# Patient Record
Sex: Female | Born: 1965 | Race: White | Hispanic: No | Marital: Married | Smoking: Never smoker
Health system: Southern US, Community
[De-identification: ages and names within clinical notes are randomized; demographics above are authoritative.]

## PROBLEM LIST (undated history)

## (undated) DIAGNOSIS — N390 Urinary tract infection, site not specified: Secondary | ICD-10-CM

## (undated) DIAGNOSIS — R300 Dysuria: Secondary | ICD-10-CM

## (undated) HISTORY — DX: Dysuria: R30.0

## (undated) HISTORY — PX: COLONOSCOPY WITH PROPOFOL: SHX5780

## (undated) HISTORY — DX: Urinary tract infection, site not specified: N39.0

---

## 2005-09-23 ENCOUNTER — Inpatient Hospital Stay: Payer: Self-pay | Admitting: Obstetrics and Gynecology

## 2007-11-10 ENCOUNTER — Ambulatory Visit: Payer: Self-pay | Admitting: Obstetrics and Gynecology

## 2008-02-21 ENCOUNTER — Ambulatory Visit: Payer: Self-pay | Admitting: Gastroenterology

## 2008-11-24 ENCOUNTER — Ambulatory Visit: Payer: Self-pay | Admitting: Obstetrics and Gynecology

## 2009-12-13 ENCOUNTER — Ambulatory Visit: Payer: Self-pay | Admitting: Obstetrics and Gynecology

## 2011-01-14 ENCOUNTER — Ambulatory Visit: Payer: Self-pay | Admitting: Obstetrics and Gynecology

## 2011-08-28 ENCOUNTER — Emergency Department: Payer: Self-pay | Admitting: *Deleted

## 2011-09-03 ENCOUNTER — Emergency Department: Payer: Self-pay | Admitting: Internal Medicine

## 2012-01-19 ENCOUNTER — Ambulatory Visit: Payer: Self-pay | Admitting: Obstetrics and Gynecology

## 2013-01-19 ENCOUNTER — Ambulatory Visit: Payer: Self-pay | Admitting: Obstetrics and Gynecology

## 2013-12-30 ENCOUNTER — Emergency Department: Payer: Self-pay | Admitting: Emergency Medicine

## 2013-12-30 LAB — CBC
HCT: 41 % (ref 35.0–47.0)
HGB: 13.4 g/dL (ref 12.0–16.0)
MCH: 29.9 pg (ref 26.0–34.0)
MCHC: 32.5 g/dL (ref 32.0–36.0)
MCV: 92 fL (ref 80–100)
Platelet: 228 10*3/uL (ref 150–440)
RBC: 4.47 10*6/uL (ref 3.80–5.20)
RDW: 13 % (ref 11.5–14.5)
WBC: 8.1 10*3/uL (ref 3.6–11.0)

## 2013-12-30 LAB — COMPREHENSIVE METABOLIC PANEL
Albumin: 4.3 g/dL (ref 3.4–5.0)
Alkaline Phosphatase: 69 U/L
Anion Gap: 6 — ABNORMAL LOW (ref 7–16)
BUN: 19 mg/dL — ABNORMAL HIGH (ref 7–18)
Bilirubin,Total: 0.7 mg/dL (ref 0.2–1.0)
Calcium, Total: 8.7 mg/dL (ref 8.5–10.1)
Chloride: 107 mmol/L (ref 98–107)
Co2: 24 mmol/L (ref 21–32)
Creatinine: 0.82 mg/dL (ref 0.60–1.30)
EGFR (African American): >60
EGFR (Non-African Amer.): >60
Glucose: 109 mg/dL — ABNORMAL HIGH (ref 65–99)
Osmolality: 277 (ref 275–301)
Potassium: 3.6 mmol/L (ref 3.5–5.1)
SGOT(AST): 31 U/L (ref 15–37)
SGPT (ALT): 20 U/L
Sodium: 137 mmol/L (ref 136–145)
Total Protein: 8.1 g/dL (ref 6.4–8.2)

## 2013-12-30 LAB — URINALYSIS, COMPLETE
Bacteria: NONE SEEN
Bilirubin,UR: NEGATIVE
Glucose,UR: NEGATIVE mg/dL (ref 0–75)
Ketone: NEGATIVE
Leukocyte Esterase: NEGATIVE
Nitrite: NEGATIVE
Ph: 6 (ref 4.5–8.0)
Protein: NEGATIVE
RBC,UR: 3 /HPF (ref 0–5)
Specific Gravity: 1.026 (ref 1.003–1.030)
Squamous Epithelial: <1
WBC UR: <1 /HPF (ref 0–5)

## 2014-01-30 ENCOUNTER — Ambulatory Visit: Payer: Self-pay | Admitting: Obstetrics and Gynecology

## 2015-01-12 ENCOUNTER — Ambulatory Visit: Payer: Self-pay

## 2015-01-23 ENCOUNTER — Other Ambulatory Visit: Payer: Self-pay | Admitting: Obstetrics and Gynecology

## 2015-01-23 DIAGNOSIS — Z1231 Encounter for screening mammogram for malignant neoplasm of breast: Secondary | ICD-10-CM

## 2015-02-05 ENCOUNTER — Encounter: Payer: Self-pay | Admitting: *Deleted

## 2015-02-05 ENCOUNTER — Ambulatory Visit (INDEPENDENT_AMBULATORY_CARE_PROVIDER_SITE_OTHER): Payer: 59 | Admitting: Obstetrics and Gynecology

## 2015-02-05 VITALS — BP 110/75 | HR 75 | Resp 16 | Ht 64.0 in | Wt 150.7 lb

## 2015-02-05 DIAGNOSIS — N959 Unspecified menopausal and perimenopausal disorder: Secondary | ICD-10-CM | POA: Diagnosis not present

## 2015-02-05 DIAGNOSIS — N390 Urinary tract infection, site not specified: Secondary | ICD-10-CM

## 2015-02-05 LAB — MICROSCOPIC EXAMINATION
Bacteria, UA: NONE SEEN
RBC, UA: NONE SEEN /hpf (ref 0–?)
Renal Epithel, UA: NONE SEEN /hpf
WBC, UA: NONE SEEN /hpf (ref 0–?)

## 2015-02-05 LAB — URINALYSIS, COMPLETE
Bilirubin, UA: NEGATIVE
Glucose, UA: NEGATIVE
Ketones, UA: NEGATIVE
Leukocytes, UA: NEGATIVE
Nitrite, UA: NEGATIVE
Protein, UA: NEGATIVE
RBC, UA: NEGATIVE
Specific Gravity, UA: >1.03 — ABNORMAL HIGH (ref 1.005–1.030)
Urobilinogen, Ur: 0.2 mg/dL (ref 0.2–1.0)
pH, UA: 5.5 (ref 5.0–7.5)

## 2015-02-05 LAB — BLADDER SCAN AMB NON-IMAGING: Scan Result: 38

## 2015-02-05 MED ORDER — CEPHALEXIN 250 MG PO CAPS: ORAL_CAPSULE | ORAL | Status: DC

## 2015-02-05 MED ORDER — ESTROGENS, CONJUGATED 0.625 MG/GM VA CREA: 1.0000 | TOPICAL_CREAM | Freq: Every day | VAGINAL | Status: DC

## 2015-02-05 NOTE — Patient Instructions (Signed)

## 2015-02-05 NOTE — Progress Notes (Signed)
02/05/2015 10:19 AM   Kari Guerrero [birth year] [id number]  Referring provider: No referring provider defined for this encounter.  Chief Complaint  Patient presents with  . Establish Care  . Recurrent UTI    HPI: Patient is a 49 year old female presenting today as a referral from her GYN provider complaint of recurrent urinary tract infections. She states that they have at been occurring for many years ever since she has been married to her husband. She does notice an association between intercourse and urinary tract infections. She has noticed that they has become more frequent with 3 in the last year. No possible exposure to STI's. He denies any vaginal complaints. She is asymptomatic today.   Increasing fluids, drinking cranberry juice and voiding post intercourse typically improves her symptoms.  No urinary symptoms at baseline.   No stone history.  She is post menopausal.  LMP 1 year ago.   PMH: Past Medical History  Diagnosis Date  . Dysuria   . UTI (lower urinary tract infection)     Surgical History: Past Surgical History  Procedure Laterality Date  . Cesarean section      Home Medications:    Medication List       This list is accurate as of: 02/05/15 10:19 AM.  Always use your most recent med list.               MULTI-VITAMINS Tabs  Take by mouth.        Allergies:  Allergies  Allergen Reactions  . Codeine     Other reaction(s): Vomiting  . Macrobid [employer] Macro]   . Sulfa Antibiotics     Family History: Family History  Problem Relation Age of Onset  . Colon cancer Father     Social History:  reports that she has never smoked. She does not have any smokeless tobacco history on file. She reports that she does not drink alcohol or use illicit drugs.  ROS: UROLOGY Frequent Urination?: Yes Hard to postpone urination?: Yes Burning/pain with urination?: Yes Get up at night to urinate?: No Leakage of urine?: No Urine  stream starts and stops?: No Trouble starting stream?: No Do you have to strain to urinate?: No Blood in urine?: No Urinary tract infection?: Yes Sexually transmitted disease?: No Injury to kidneys or bladder?: No Painful intercourse?: No Weak stream?: No Currently pregnant?: No Vaginal bleeding?: No Last menstrual period?: 12/2013  Gastrointestinal Nausea?: No Vomiting?: No Indigestion/heartburn?: No Diarrhea?: No Constipation?: No  Constitutional Fever: No Night sweats?: No Weight loss?: No Fatigue?: No  Skin Skin rash/lesions?: No Itching?: No  Eyes Blurred vision?: No Double vision?: No  Ears/Nose/Throat Sore throat?: No Sinus problems?: No  Hematologic/Lymphatic Swollen glands?: No Easy bruising?: No  Cardiovascular Leg swelling?: No Chest pain?: No  Respiratory Cough?: No Shortness of breath?: No  Endocrine Excessive thirst?: No  Musculoskeletal Back pain?: No Joint pain?: No  Neurological Headaches?: No Dizziness?: No  Psychologic Depression?: No Anxiety?: No  Physical Exam: BP 110/75 mmHg  Pulse 75  Resp 16  Ht 5\' 4"  (1.626 m)  Wt 150 lb 11.2 oz (68.357 kg)  BMI 25.85 kg/m2  Constitutional:  Alert and oriented, No acute distress. HEENT: [place] AT, moist mucus membranes.  Trachea midline, no masses. Cardiovascular: No clubbing, cyanosis, or edema. Respiratory: Normal respiratory effort, no increased work of breathing. GI: Abdomen is soft, nontender, nondistended, no abdominal masses GU: No CVA tenderness.  Skin: No rashes, bruises or suspicious lesions. Lymph: No cervical or  inguinal adenopathy. Neurologic: Grossly intact, no focal deficits, moving all 4 extremities. Psychiatric: Normal mood and affect.  Laboratory Data:   Urinalysis    Component Value Date/Time   COLORURINE Yellow 12/30/2013 0315   APPEARANCEUR Clear 12/30/2013 0315   LABSPEC 1.026 12/30/2013 0315   PHURINE 6.0 12/30/2013 0315   GLUCOSEU Negative  12/30/2013 0315   HGBUR 1+ 12/30/2013 0315   BILIRUBINUR Negative 12/30/2013 0315   KETONESUR Negative 12/30/2013 0315   PROTEINUR Negative 12/30/2013 0315   NITRITE Negative 12/30/2013 0315   LEUKOCYTESUR Negative 12/30/2013 0315    Pertinent Imaging:   Assessment & Plan:   1. Frequent UTI- UTI prevention strategies discussed.  Good perineal hygiene reviewed. Patient is encouraged to increase daily water intake, start cranberry supplements to prevent invasive colonization along the urinary tract and probiotics, especially lactobacillus to restore normal vaginal flora. -Post coital Keflex prescribed. - Urinalysis, Complete - BLADDER SCAN AMB NON-IMAGING  2. Vaginal atrophy-  Vaginal estrogen cream prescribed to be applied to urethra M-W-F nights. Also suggested natural vaginal lubricants such as coconut and olive oils prior to intercourse to reduce frictional irritation.  Return in about 3 months (around 05/08/2015) for Recurrent UTI.  These notes generated with voice recognition software. I apologize for typographical errors.  Herbert Moors, [facility] [address], [place], [place] [address] [phone]

## 2015-02-06 ENCOUNTER — Ambulatory Visit
Admission: RE | Admit: 2015-02-06 | Discharge: 2015-02-06 | Disposition: A | Discharge status: Home / Self Care | Payer: 59 | Source: Ambulatory Visit | Attending: Obstetrics and Gynecology | Admitting: Obstetrics and Gynecology

## 2015-02-06 DIAGNOSIS — Z1231 Encounter for screening mammogram for malignant neoplasm of breast: Secondary | ICD-10-CM

## 2015-05-08 ENCOUNTER — Encounter: Payer: Self-pay | Admitting: Obstetrics and Gynecology

## 2015-05-08 ENCOUNTER — Ambulatory Visit (INDEPENDENT_AMBULATORY_CARE_PROVIDER_SITE_OTHER): Payer: 59 | Admitting: Obstetrics and Gynecology

## 2015-05-08 VITALS — BP 109/73 | HR 68 | Resp 16 | Ht 64.0 in | Wt 153.7 lb

## 2015-05-08 DIAGNOSIS — N390 Urinary tract infection, site not specified: Secondary | ICD-10-CM | POA: Diagnosis not present

## 2015-05-08 LAB — URINALYSIS, COMPLETE
Bilirubin, UA: NEGATIVE
Glucose, UA: NEGATIVE
Ketones, UA: NEGATIVE
Leukocytes, UA: NEGATIVE
Nitrite, UA: NEGATIVE
Protein, UA: NEGATIVE
Specific Gravity, UA: >1.03 — ABNORMAL HIGH (ref 1.005–1.030)
Urobilinogen, Ur: 0.2 mg/dL (ref 0.2–1.0)
pH, UA: 5 (ref 5.0–7.5)

## 2015-05-08 LAB — MICROSCOPIC EXAMINATION

## 2015-05-08 NOTE — Progress Notes (Signed)
1:16 PM   Kari Guerrero 08/16/1965 [id number]  Referring provider: No referring provider defined for this encounter.  Chief Complaint  Patient presents with  . Recurrent UTI    HPI: Patient is a 50 year old female presenting today as a referral from her GYN provider complaint of recurrent urinary tract infections. She states that they have at been occurring for many years ever since she has been married to her husband. She does notice an association between intercourse and urinary tract infections. She has noticed that they has become more frequent with 3 in the last year. No possible exposure to STI's. He denies any vaginal complaints. She is asymptomatic today.   Increasing fluids, drinking cranberry juice and voiding post intercourse typically improves her symptoms.  No urinary symptoms at baseline.   No stone history.  She is post menopausal.  LMP 1 year ago.  Interval History: Patient presents today for follow-up. She denies any urinary symptoms since her last visit. She has been using Coconut oil for vaginal moisturizer and for lubrication. She has increased her fluid intake has been taking her daily cranberry supplements. She reports that she has been feeling very well. She did not start using her vaginal estrogen cream as discussed at last visit because she felt that the coconut oil was working very well.   PMH: Past Medical History  Diagnosis Date  . Dysuria   . UTI (lower urinary tract infection)     Surgical History: Past Surgical History  Procedure Laterality Date  . Cesarean section      Home Medications:    Medication List       This list is accurate as of: 05/08/15 11:59 PM.  Always use your most recent med list.               conjugated estrogens vaginal cream  Commonly known as:  PREMARIN  Place 1 Applicatorful vaginally daily. Apply blueberry sized amount of cream to urethra (vaginal opening) 3 times per week at bedtime.     MULTI-VITAMINS  Tabs  Take by mouth.        Allergies:  Allergies  Allergen Reactions  . Codeine     Other reaction(s): Vomiting  . Macrobid [employer] Macro]   . Sulfa Antibiotics     Family History: Family History  Problem Relation Age of Onset  . Colon cancer Father   . Breast cancer Paternal Aunt     2. >50  . Breast cancer Maternal Grandmother 26    Social History:  reports that she has never smoked. She has never used smokeless tobacco. She reports that she does not drink alcohol or use illicit drugs.  ROS: UROLOGY Frequent Urination?: No Hard to postpone urination?: No Burning/pain with urination?: No Get up at night to urinate?: No Leakage of urine?: No Urine stream starts and stops?: No Trouble starting stream?: No Do you have to strain to urinate?: No Blood in urine?: No Urinary tract infection?: No Sexually transmitted disease?: No Injury to kidneys or bladder?: No Painful intercourse?: No Weak stream?: No Currently pregnant?: No Vaginal bleeding?: No Last menstrual period?: 12/2013  Gastrointestinal Nausea?: No Vomiting?: No Indigestion/heartburn?: No Diarrhea?: No Constipation?: No  Constitutional Fever: No Night sweats?: No Weight loss?: No Fatigue?: No  Skin Skin rash/lesions?: No Itching?: No  Eyes Blurred vision?: No Double vision?: No  Ears/Nose/Throat Sore throat?: No Sinus problems?: No  Hematologic/Lymphatic Swollen glands?: No Easy bruising?: No  Cardiovascular Leg swelling?: No Chest pain?: No  Respiratory Cough?: No Shortness of breath?: No  Endocrine Excessive thirst?: No  Musculoskeletal Back pain?: No Joint pain?: No  Neurological Headaches?: No Dizziness?: No  Psychologic Depression?: No Anxiety?: No  Physical Exam: BP 109/73 mmHg  Pulse 68  Resp 16  Ht 5\' 4"  (1.626 m)  Wt 153 lb 11.2 oz (69.718 kg)  BMI 26.37 kg/m2  Constitutional:  Alert and oriented, No acute distress. HEENT: [place] AT,  moist mucus membranes.  Trachea midline, no masses. Cardiovascular: No clubbing, cyanosis, or edema. Respiratory: Normal respiratory effort, no increased work of breathing. GI: Abdomen is soft, nontender, nondistended, no abdominal masses GU: No CVA tenderness.  Skin: No rashes, bruises or suspicious lesions. Lymph: No cervical or inguinal adenopathy. Neurologic: Grossly intact, no focal deficits, moving all 4 extremities. Psychiatric: Normal mood and affect.  Laboratory Data:   Urinalysis    Component Value Date/Time   COLORURINE Yellow 12/30/2013 0315   APPEARANCEUR Clear 12/30/2013 0315   LABSPEC 1.026 12/30/2013 0315   PHURINE 6.0 12/30/2013 0315   GLUCOSEU Negative 05/08/2015 0843   GLUCOSEU Negative 12/30/2013 0315   HGBUR 1+ 12/30/2013 0315   BILIRUBINUR Negative 05/08/2015 0843   BILIRUBINUR Negative 12/30/2013 0315   KETONESUR Negative 12/30/2013 0315   PROTEINUR Negative 12/30/2013 0315   NITRITE Negative 05/08/2015 0843   NITRITE Negative 12/30/2013 0315   LEUKOCYTESUR Negative 05/08/2015 0843   LEUKOCYTESUR Negative 12/30/2013 0315    Pertinent Imaging:   Assessment & Plan:   1. Frequent UTI- - Continue UTI prevention strategies -Continue post coital Keflex as needed - Urinalysis, Complete - BLADDER SCAN AMB NON-IMAGING  2. Vaginal atrophy-  Continue vaginal moisturizers.  Return if symptoms worsen or fail to improve.  These notes generated with voice recognition software. I apologize for typographical errors.  Herbert Moors, [facility] [address], [place],  [address] [phone]

## 2015-12-18 ENCOUNTER — Telehealth: Payer: Self-pay | Admitting: Urology

## 2015-12-18 NOTE — Telephone Encounter (Signed)
Patient called and stated that she was having frequent urination and pain with urination. She feels that she may have a UTI. Per Kari Guerrero she can come by and drop off a urine sample and see her to discuss her symptoms.  Patient is on the schedule for 8:30 12-19-15  Just wanted you to be aware to watch for any UA cultures    Thanks, [name]

## 2015-12-19 ENCOUNTER — Ambulatory Visit (INDEPENDENT_AMBULATORY_CARE_PROVIDER_SITE_OTHER): Payer: 59

## 2015-12-19 VITALS — BP 112/74 | HR 74 | Temp 98.3°F | Wt 153.9 lb

## 2015-12-19 DIAGNOSIS — N390 Urinary tract infection, site not specified: Secondary | ICD-10-CM | POA: Diagnosis not present

## 2015-12-19 LAB — URINALYSIS, COMPLETE
Bilirubin, UA: NEGATIVE
Glucose, UA: NEGATIVE
Ketones, UA: NEGATIVE
Nitrite, UA: NEGATIVE
Protein, UA: NEGATIVE
Specific Gravity, UA: 1.025 (ref 1.005–1.030)
Urobilinogen, Ur: 0.2 mg/dL (ref 0.2–1.0)
pH, UA: 5.5 (ref 5.0–7.5)

## 2015-12-19 LAB — MICROSCOPIC EXAMINATION: WBC, UA: >30 /hpf — AB (ref 0–?)

## 2015-12-19 NOTE — Telephone Encounter (Signed)
Please make sure that the patient performs the proper procedure to collect her urine.  I also want her to wait until the UA is completed so that I can be sure it is not a contaminated specimen.

## 2015-12-19 NOTE — Progress Notes (Signed)
Pt came in today with c/o urinary frequency, hard to postpone urination, and dysuria. Pt gave a clean catch specimen for u/a and cx.  Blood pressure 112/74, pulse 74, temperature 98.3 F (36.8 C), weight 153 lb 14.4 oz (69.8 kg).

## 2015-12-22 ENCOUNTER — Other Ambulatory Visit: Payer: Self-pay | Admitting: Urology

## 2015-12-22 LAB — CULTURE, URINE COMPREHENSIVE

## 2015-12-22 MED ORDER — CIPROFLOXACIN HCL 500 MG PO TABS: 500.0000 mg | ORAL_TABLET | Freq: Two times a day (BID) | ORAL | 0 refills | Status: DC

## 2015-12-24 ENCOUNTER — Telehealth: Payer: Self-pay | Admitting: Family Medicine

## 2015-12-24 NOTE — Telephone Encounter (Signed)
Left message on machine for patient to return call

## 2015-12-24 NOTE — Telephone Encounter (Signed)
-----   Message from Nori Riis, PA-C sent at 12/22/2015  6:25 PM EDT ----- Please notify the patient that she has a positive urine culture.  I have sent a prescription for ciprofloxacin 500 mg twice daily 7 days to the [facility] in [place].  If she is still taking the Keflex after sex, she needs to discontinue this antibiotic and tell the ciprofloxacin is completed. She may then resume the Keflex after sex.  She does not need to follow-up unless her symptoms persist or worsen.  She will need an appointment in January if she wishes to continue the post coital antibiotic, Keflex.

## 2015-12-24 NOTE — Telephone Encounter (Signed)
Patient notified and voiced understanding. She is not currently using the Keflex ABX after intercourse. She never started that and does not feel comfortable taking an antibiotic that often.

## 2016-01-29 ENCOUNTER — Other Ambulatory Visit: Payer: Self-pay | Admitting: Obstetrics and Gynecology

## 2016-01-29 DIAGNOSIS — Z1231 Encounter for screening mammogram for malignant neoplasm of breast: Secondary | ICD-10-CM

## 2016-03-05 ENCOUNTER — Ambulatory Visit
Admission: RE | Admit: 2016-03-05 | Discharge: 2016-03-05 | Disposition: A | Discharge status: Home / Self Care | Payer: 59 | Source: Ambulatory Visit | Attending: Obstetrics and Gynecology | Admitting: Obstetrics and Gynecology

## 2016-03-05 DIAGNOSIS — Z1231 Encounter for screening mammogram for malignant neoplasm of breast: Secondary | ICD-10-CM

## 2017-02-03 ENCOUNTER — Other Ambulatory Visit: Payer: Self-pay | Admitting: Obstetrics and Gynecology

## 2017-02-03 DIAGNOSIS — Z1231 Encounter for screening mammogram for malignant neoplasm of breast: Secondary | ICD-10-CM

## 2017-03-11 ENCOUNTER — Ambulatory Visit
Admission: RE | Admit: 2017-03-11 | Discharge: 2017-03-11 | Disposition: A | Discharge status: Home / Self Care | Payer: 59 | Source: Ambulatory Visit | Attending: Obstetrics and Gynecology | Admitting: Obstetrics and Gynecology

## 2017-03-11 DIAGNOSIS — Z1231 Encounter for screening mammogram for malignant neoplasm of breast: Secondary | ICD-10-CM | POA: Diagnosis not present

## 2018-02-09 ENCOUNTER — Other Ambulatory Visit: Payer: Self-pay | Admitting: Obstetrics and Gynecology

## 2018-02-09 DIAGNOSIS — Z1231 Encounter for screening mammogram for malignant neoplasm of breast: Secondary | ICD-10-CM

## 2018-03-16 ENCOUNTER — Ambulatory Visit
Admission: RE | Admit: 2018-03-16 | Discharge: 2018-03-16 | Disposition: A | Discharge status: Home / Self Care | Payer: Managed Care, Other (non HMO) | Source: Ambulatory Visit | Attending: Obstetrics and Gynecology | Admitting: Obstetrics and Gynecology

## 2018-03-16 DIAGNOSIS — Z1231 Encounter for screening mammogram for malignant neoplasm of breast: Secondary | ICD-10-CM | POA: Diagnosis present

## 2019-02-15 ENCOUNTER — Other Ambulatory Visit: Payer: Self-pay | Admitting: Obstetrics and Gynecology

## 2019-02-15 DIAGNOSIS — Z1231 Encounter for screening mammogram for malignant neoplasm of breast: Secondary | ICD-10-CM

## 2019-03-22 ENCOUNTER — Ambulatory Visit
Admission: RE | Admit: 2019-03-22 | Discharge: 2019-03-22 | Disposition: A | Discharge status: Home / Self Care | Payer: Managed Care, Other (non HMO) | Source: Ambulatory Visit | Attending: Obstetrics and Gynecology | Admitting: Obstetrics and Gynecology

## 2019-03-22 DIAGNOSIS — Z1231 Encounter for screening mammogram for malignant neoplasm of breast: Secondary | ICD-10-CM | POA: Diagnosis present

## 2019-11-02 ENCOUNTER — Other Ambulatory Visit: Payer: Self-pay

## 2019-11-02 ENCOUNTER — Other Ambulatory Visit: Payer: Self-pay | Admitting: Dermatology

## 2019-11-02 ENCOUNTER — Ambulatory Visit: Payer: Managed Care, Other (non HMO) | Admitting: Dermatology

## 2019-11-02 DIAGNOSIS — D485 Neoplasm of uncertain behavior of skin: Secondary | ICD-10-CM

## 2019-11-02 NOTE — Progress Notes (Signed)
   Follow-Up Visit   Subjective  Kari Guerrero is a 54 y.o. female who presents for the following: Other (Bump on scalp that just came up a couple months ago).  The following portions of the chart were reviewed this encounter and updated as appropriate:  Tobacco  Allergies  Meds  Problems  Med Hx  Surg Hx  Fam Hx     Review of Systems:  No other skin or systemic complaints except as noted in HPI or Assessment and Plan.  Objective  Well appearing patient in no apparent distress; mood and affect are within normal limits.  A focused examination was performed including scalp. Relevant physical exam findings are noted in the Assessment and Plan.  Objective  Right of midline ant vertex scalp: 0.9cm brown papule    Assessment & Plan    Neoplasm of uncertain behavior of skin Right of midline ant vertex scalp New and changing mole, r/o dysplasia vs skin Cancer  Epidermal / dermal shaving  Lesion diameter (cm):  0.9 Informed consent: discussed and consent obtained   Timeout: patient name, date of birth, surgical site, and procedure verified   Procedure prep:  Patient was prepped and draped in usual sterile fashion Prep type:  Isopropyl alcohol Anesthesia: the lesion was anesthetized in a standard fashion   Anesthetic:  1% lidocaine w/ epinephrine 1-100,000 buffered w/ 8.4% NaHCO3 Instrument used: flexible razor blade   Hemostasis achieved with: pressure, aluminum chloride and electrodesiccation   Outcome: patient tolerated procedure well   Post-procedure details: sterile dressing applied and wound care instructions given   Dressing type: bandage and petrolatum    Anatomic Pathology Report  Return if symptoms worsen or fail to improve.  I, Ashok Cordia, CMA, am acting as scribe for Sarina Ser, MD .  Documentation: I have reviewed the above documentation for accuracy and completeness, and I agree with the above.  Sarina Ser, MD

## 2019-11-02 NOTE — Patient Instructions (Signed)

## 2019-11-06 ENCOUNTER — Encounter: Payer: Self-pay | Admitting: Dermatology

## 2019-11-09 LAB — ANATOMIC PATHOLOGY REPORT

## 2019-11-10 ENCOUNTER — Telehealth: Payer: Self-pay

## 2019-11-10 NOTE — Telephone Encounter (Signed)
Left msg for patient to call office for bx results, JS 

## 2019-11-29 ENCOUNTER — Telehealth: Payer: Self-pay

## 2019-11-29 NOTE — Telephone Encounter (Signed)
-----   Message from Ralene Bathe, MD sent at 11/10/2019  1:59 PM EDT ----- Benign keratosis of scalp No further treatment needed unless recurs.

## 2019-11-29 NOTE — Telephone Encounter (Signed)
Left message on voicemail to return my call.  

## 2019-11-30 ENCOUNTER — Telehealth: Payer: Self-pay

## 2019-11-30 NOTE — Telephone Encounter (Signed)
-----   Message from Ralene Bathe, MD sent at 11/10/2019  1:59 PM EDT ----- Benign keratosis of scalp No further treatment needed unless recurs.

## 2019-11-30 NOTE — Telephone Encounter (Signed)
Advised pt of bx result/sh ?

## 2020-02-21 ENCOUNTER — Other Ambulatory Visit: Payer: Self-pay | Admitting: Obstetrics and Gynecology

## 2020-02-21 DIAGNOSIS — Z1231 Encounter for screening mammogram for malignant neoplasm of breast: Secondary | ICD-10-CM

## 2020-03-30 ENCOUNTER — Other Ambulatory Visit: Payer: Self-pay

## 2020-03-30 ENCOUNTER — Ambulatory Visit
Admission: RE | Admit: 2020-03-30 | Discharge: 2020-03-30 | Disposition: A | Discharge status: Home / Self Care | Payer: Managed Care, Other (non HMO) | Source: Ambulatory Visit | Attending: Obstetrics and Gynecology | Admitting: Obstetrics and Gynecology

## 2020-03-30 DIAGNOSIS — Z1231 Encounter for screening mammogram for malignant neoplasm of breast: Secondary | ICD-10-CM | POA: Insufficient documentation

## 2020-05-09 ENCOUNTER — Other Ambulatory Visit: Payer: Self-pay

## 2020-05-09 ENCOUNTER — Ambulatory Visit: Payer: Managed Care, Other (non HMO) | Admitting: Dermatology

## 2020-05-09 DIAGNOSIS — Z1283 Encounter for screening for malignant neoplasm of skin: Secondary | ICD-10-CM

## 2020-05-09 DIAGNOSIS — L821 Other seborrheic keratosis: Secondary | ICD-10-CM

## 2020-05-09 DIAGNOSIS — D229 Melanocytic nevi, unspecified: Secondary | ICD-10-CM

## 2020-05-09 DIAGNOSIS — L820 Inflamed seborrheic keratosis: Secondary | ICD-10-CM | POA: Diagnosis not present

## 2020-05-09 DIAGNOSIS — Q825 Congenital non-neoplastic nevus: Secondary | ICD-10-CM | POA: Diagnosis not present

## 2020-05-09 DIAGNOSIS — L814 Other melanin hyperpigmentation: Secondary | ICD-10-CM | POA: Diagnosis not present

## 2020-05-09 DIAGNOSIS — L578 Other skin changes due to chronic exposure to nonionizing radiation: Secondary | ICD-10-CM

## 2020-05-09 DIAGNOSIS — D1800 Hemangioma unspecified site: Secondary | ICD-10-CM

## 2020-05-09 NOTE — Progress Notes (Unsigned)
   Follow-Up Visit   Subjective  Kari Guerrero is a 55 y.o. female who presents for the following: Annual Exam. The patient presents for Total-Body Skin Exam (TBSE) for skin cancer screening and mole check.  The following portions of the chart were reviewed this encounter and updated as appropriate:   Tobacco  Allergies  Meds  Problems  Med Hx  Surg Hx  Fam Hx     Review of Systems:  No other skin or systemic complaints except as noted in HPI or Assessment and Plan.  Objective  Well appearing patient in no apparent distress; mood and affect are within normal limits.  A full examination was performed including scalp, head, eyes, ears, nose, lips, neck, chest, axillae, abdomen, back, buttocks, bilateral upper extremities, bilateral lower extremities, hands, feet, fingers, toes, fingernails, and toenails. All findings within normal limits unless otherwise noted below.  Objective  L infra axillary x 2 (2): Erythematous keratotic or waxy stuck-on papule or plaque.   Objective  R med knee: Brown macule   Assessment & Plan  Inflamed seborrheic keratosis (2) L infra axillary x 2  Destruction of lesion - L infra axillary x 2 Complexity: simple   Destruction method: cryotherapy   Informed consent: discussed and consent obtained   Timeout:  patient name, date of birth, surgical site, and procedure verified Lesion destroyed using liquid nitrogen: Yes   Region frozen until ice ball extended beyond lesion: Yes   Outcome: patient tolerated procedure well with no complications   Post-procedure details: wound care instructions given    Congenital non-neoplastic nevus R med knee  Benign appearing, observe.  Skin cancer screening   Lentigines - Scattered tan macules - Discussed due to sun exposure - Benign, observe - Call for any changes  Seborrheic Keratoses - Stuck-on, waxy, tan-brown papules and plaques  - Discussed benign etiology and prognosis. - Observe - Call for  any changes  Melanocytic Nevi - Tan-brown and/or pink-flesh-colored symmetric macules and papules - Benign appearing on exam today - Observation - Call clinic for new or changing moles - Recommend daily use of broad spectrum spf 30+ sunscreen to sun-exposed areas.   Hemangiomas - Red papules - Discussed benign nature - Observe - Call for any changes  Actinic Damage - Chronic, secondary to cumulative UV/sun exposure - diffuse scaly erythematous macules with underlying dyspigmentation - Recommend daily broad spectrum sunscreen SPF 30+ to sun-exposed areas, reapply every 2 hours as needed.  - Call for new or changing lesions.  Skin cancer screening performed today.  Return for TBSE in 1-2 years.  Luther Redo, CMA, am acting as scribe for Sarina Ser, MD .  Documentation: I have reviewed the above documentation for accuracy and completeness, and I agree with the above.  Sarina Ser, MD

## 2020-05-11 ENCOUNTER — Encounter: Payer: Self-pay | Admitting: Dermatology

## 2021-02-26 ENCOUNTER — Other Ambulatory Visit: Payer: Self-pay | Admitting: Obstetrics and Gynecology

## 2021-02-26 DIAGNOSIS — Z1231 Encounter for screening mammogram for malignant neoplasm of breast: Secondary | ICD-10-CM

## 2021-04-02 ENCOUNTER — Ambulatory Visit
Admission: RE | Admit: 2021-04-02 | Discharge: 2021-04-02 | Disposition: A | Discharge status: Home / Self Care | Payer: Managed Care, Other (non HMO) | Source: Ambulatory Visit | Attending: Obstetrics and Gynecology | Admitting: Obstetrics and Gynecology

## 2021-04-02 ENCOUNTER — Other Ambulatory Visit: Payer: Self-pay

## 2021-04-02 DIAGNOSIS — Z1231 Encounter for screening mammogram for malignant neoplasm of breast: Secondary | ICD-10-CM

## 2021-06-26 ENCOUNTER — Inpatient Hospital Stay
Admission: EM | Admit: 2021-06-26 | Discharge: 2021-06-30 | DRG: 329 | Disposition: A | Discharge status: Home / Self Care | Payer: Managed Care, Other (non HMO) | Attending: Surgery | Admitting: Surgery

## 2021-06-26 ENCOUNTER — Other Ambulatory Visit: Payer: Self-pay

## 2021-06-26 ENCOUNTER — Emergency Department: Payer: Managed Care, Other (non HMO)

## 2021-06-26 ENCOUNTER — Encounter: Payer: Self-pay | Admitting: Emergency Medicine

## 2021-06-26 DIAGNOSIS — K565 Intestinal adhesions [bands], unspecified as to partial versus complete obstruction: Principal | ICD-10-CM | POA: Diagnosis present

## 2021-06-26 DIAGNOSIS — Z882 Allergy status to sulfonamides status: Secondary | ICD-10-CM

## 2021-06-26 DIAGNOSIS — Z4659 Encounter for fitting and adjustment of other gastrointestinal appliance and device: Secondary | ICD-10-CM

## 2021-06-26 DIAGNOSIS — Z885 Allergy status to narcotic agent status: Secondary | ICD-10-CM

## 2021-06-26 DIAGNOSIS — Z888 Allergy status to other drugs, medicaments and biological substances status: Secondary | ICD-10-CM

## 2021-06-26 DIAGNOSIS — K55029 Acute infarction of small intestine, extent unspecified: Secondary | ICD-10-CM | POA: Diagnosis present

## 2021-06-26 DIAGNOSIS — Z800 Family history of malignant neoplasm of digestive organs: Secondary | ICD-10-CM

## 2021-06-26 DIAGNOSIS — K56609 Unspecified intestinal obstruction, unspecified as to partial versus complete obstruction: Principal | ICD-10-CM

## 2021-06-26 DIAGNOSIS — R1032 Left lower quadrant pain: Secondary | ICD-10-CM

## 2021-06-26 DIAGNOSIS — Z20822 Contact with and (suspected) exposure to covid-19: Secondary | ICD-10-CM | POA: Diagnosis present

## 2021-06-26 DIAGNOSIS — Z803 Family history of malignant neoplasm of breast: Secondary | ICD-10-CM

## 2021-06-26 DIAGNOSIS — E876 Hypokalemia: Secondary | ICD-10-CM | POA: Diagnosis present

## 2021-06-26 LAB — URINALYSIS, ROUTINE W REFLEX MICROSCOPIC
Bacteria, UA: NONE SEEN
Bilirubin Urine: NEGATIVE
Glucose, UA: NEGATIVE mg/dL
Hgb urine dipstick: NEGATIVE
Ketones, ur: 5 mg/dL — AB
Nitrite: NEGATIVE
Protein, ur: NEGATIVE mg/dL
Specific Gravity, Urine: 1.026 (ref 1.005–1.030)
pH: 5 (ref 5.0–8.0)

## 2021-06-26 LAB — LACTIC ACID, PLASMA: Lactic Acid, Venous: 2.2 mmol/L (ref 0.5–1.9)

## 2021-06-26 LAB — COMPREHENSIVE METABOLIC PANEL
ALT: 18 U/L (ref 0–44)
AST: 21 U/L (ref 15–41)
Albumin: 3.8 g/dL (ref 3.5–5.0)
Alkaline Phosphatase: 47 U/L (ref 38–126)
Anion gap: 8 (ref 5–15)
BUN: 24 mg/dL — ABNORMAL HIGH (ref 6–20)
CO2: 28 mmol/L (ref 22–32)
Calcium: 9.1 mg/dL (ref 8.9–10.3)
Chloride: 100 mmol/L (ref 98–111)
Creatinine, Ser: 0.78 mg/dL (ref 0.44–1.00)
GFR, Estimated: >60 mL/min (ref >60)
Glucose, Bld: 148 mg/dL — ABNORMAL HIGH (ref 70–99)
Potassium: 3.9 mmol/L (ref 3.5–5.1)
Sodium: 136 mmol/L (ref 135–145)
Total Bilirubin: 0.8 mg/dL (ref 0.3–1.2)
Total Protein: 7.5 g/dL (ref 6.5–8.1)

## 2021-06-26 LAB — LIPASE, BLOOD: Lipase: 30 U/L (ref 11–51)

## 2021-06-26 LAB — CBC
HCT: 39 % (ref 36.0–46.0)
Hemoglobin: 12.6 g/dL (ref 12.0–15.0)
MCH: 28.5 pg (ref 26.0–34.0)
MCHC: 32.3 g/dL (ref 30.0–36.0)
MCV: 88.2 fL (ref 80.0–100.0)
Platelets: 245 10*3/uL (ref 150–400)
RBC: 4.42 MIL/uL (ref 3.87–5.11)
RDW: 12.5 % (ref 11.5–15.5)
WBC: 8.8 10*3/uL (ref 4.0–10.5)
nRBC: 0 % (ref 0.0–0.2)

## 2021-06-26 LAB — HCG, QUANTITATIVE, PREGNANCY: hCG, Beta Chain, Quant, S: 3 m[IU]/mL (ref <5)

## 2021-06-26 LAB — TROPONIN I (HIGH SENSITIVITY): Troponin I (High Sensitivity): 3 ng/L (ref <18)

## 2021-06-26 MED ORDER — FENTANYL CITRATE PF 50 MCG/ML IJ SOSY
PREFILLED_SYRINGE | INTRAMUSCULAR | Status: AC
  Administered 2021-06-26: 50 ug via INTRAVENOUS
  Filled 2021-06-26: qty 1

## 2021-06-26 MED ORDER — ONDANSETRON HCL 4 MG/2ML IJ SOLN: 4.0000 mg | Freq: Once | INTRAMUSCULAR | Status: AC

## 2021-06-26 MED ORDER — ONDANSETRON HCL 4 MG/2ML IJ SOLN
INTRAMUSCULAR | Status: AC
  Administered 2021-06-26: 4 mg via INTRAVENOUS
  Filled 2021-06-26: qty 2

## 2021-06-26 MED ORDER — FENTANYL CITRATE PF 50 MCG/ML IJ SOSY: 50.0000 ug | PREFILLED_SYRINGE | Freq: Once | INTRAMUSCULAR | Status: DC

## 2021-06-26 MED ORDER — LACTATED RINGERS IV BOLUS
1000.0000 mL | Freq: Once | INTRAVENOUS | Status: AC
  Administered 2021-06-26: 1000 mL via INTRAVENOUS

## 2021-06-26 MED ORDER — ONDANSETRON HCL 4 MG/2ML IJ SOLN: 4.0000 mg | Freq: Once | INTRAMUSCULAR | Status: DC

## 2021-06-26 MED ORDER — FENTANYL CITRATE PF 50 MCG/ML IJ SOSY: 50.0000 ug | PREFILLED_SYRINGE | Freq: Once | INTRAMUSCULAR | Status: AC

## 2021-06-26 MED ORDER — MAGNESIUM SULFATE 2 GM/50ML IV SOLN
2.0000 g | Freq: Once | INTRAVENOUS | Status: AC
  Administered 2021-06-26: 2 g via INTRAVENOUS
  Filled 2021-06-26: qty 50

## 2021-06-26 NOTE — ED Provider Notes (Addendum)
[facility] Provider Note    Event Date/Time   First MD Initiated Contact with Patient 06/26/21 2255     (approximate)   History   Abdominal Pain   HPI  Kari Guerrero is a 56 y.o. female with no significant past medical history who presents for evaluation of abdominal pain.  Patient reports sudden onset of left lower quadrant abdominal pain while she was walking in her house this evening.  The pain has been constant for several hours, sharp, severe, associated with nausea and several episodes of nonbloody nonbilious emesis.  No diarrhea, no constipation, she is passing flatus, no chest pain or shortness of breath, no dysuria or hematuria.  Patient has had 2 C-sections but no other abdominal pain.     Past Medical History:  Diagnosis Date   Dysuria    UTI (lower urinary tract infection)     Past Surgical History:  Procedure Laterality Date   CESAREAN SECTION       Physical Exam   Triage Vital Signs: ED Triage Vitals  Enc Vitals Group     BP 06/26/21 2006 126/83     Pulse Rate 06/26/21 2006 80     Resp 06/26/21 2006 16     Temp 06/26/21 2006 98.4 F (36.9 C)     Temp Source 06/26/21 2006 Oral     SpO2 06/26/21 2006 100 %     Weight 06/26/21 2007 145 lb (65.8 kg)     Height 06/26/21 2007 5\' 4"  (1.626 m)     Head Circumference --      Peak Flow --      Pain Score 06/26/21 2007 8     Pain Loc --      Pain Edu? --      Excl. in GC? --     Most recent vital signs: Vitals:   06/26/21 2330 06/27/21 0100  BP: (!) 146/77 130/89  Pulse: 72 75  Resp: (!) 27 (!) 25  Temp:    SpO2: 99% 100%     Constitutional: Alert and oriented, curled over and actively moaning and vomiting. HEENT:      Head: Normocephalic and atraumatic.         Eyes: Conjunctivae are normal. Sclera is non-icteric.       Mouth/Throat: Mucous membranes are moist.       Neck: Supple with no signs of meningismus. Cardiovascular: Regular rate and rhythm. No murmurs,  gallops, or rubs. 2+ symmetrical distal pulses are present in all extremities.  Respiratory: Normal respiratory effort. Lungs are clear to auscultation bilaterally.  Gastrointestinal: Soft, tender to palpation on the LLQ, and non distended with positive bowel sounds. No rebound or guarding. Genitourinary: No CVA tenderness. Musculoskeletal:  No edema, cyanosis, or erythema of extremities. Neurologic: Normal speech and language. Face is symmetric. Moving all extremities. No gross focal neurologic deficits are appreciated. Skin: Skin is warm, dry and intact. No rash noted. Psychiatric: Mood and affect are normal. Speech and behavior are normal.  ED Results / Procedures / Treatments   Labs (all labs ordered are listed, but only abnormal results are displayed) Labs Reviewed  COMPREHENSIVE METABOLIC PANEL - Abnormal; Notable for the following components:      Result Value   Glucose, Bld 148 (*)    BUN 24 (*)    All other components within normal limits  URINALYSIS, ROUTINE W REFLEX MICROSCOPIC - Abnormal; Notable for the following components:   Color, Urine YELLOW (*)  APPearance HAZY (*)    Ketones, ur 5 (*)    Leukocytes,Ua MODERATE (*)    All other components within normal limits  LACTIC ACID, PLASMA - Abnormal; Notable for the following components:   Lactic Acid, Venous 2.2 (*)    All other components within normal limits  RESP PANEL BY RT-PCR (FLU A&B, COVID) ARPGX2  LIPASE, BLOOD  CBC  HCG, QUANTITATIVE, PREGNANCY  LACTIC ACID, PLASMA  TROPONIN I (HIGH SENSITIVITY)     EKG  ED ECG REPORT I, Nita Sickle, the attending physician, personally viewed and interpreted this ECG.  Normal sinus rhythm with a rate of 70, normal intervals, normal axis, no ST elevations or depressions.  Normal EKG  RADIOLOGY I, Nita Sickle, attending MD, have personally viewed and interpreted the images obtained during this visit as below:  CT renal with no signs of kidney stones or  any other pathology   ___________________________________________________ Interpretation by Radiologist:  CT Renal Stone Study  Result Date: 06/26/2021 CLINICAL DATA:  Flank pain, kidney stone suspected. Left lower quadrant pain starting suddenly. EXAM: CT ABDOMEN AND PELVIS WITHOUT CONTRAST TECHNIQUE: Multidetector CT imaging of the abdomen and pelvis was performed following the standard protocol without IV contrast. RADIATION DOSE REDUCTION: This exam was performed according to the departmental dose-optimization program which includes automated exposure control, adjustment of the mA and/or kV according to patient size and/or use of iterative reconstruction technique. COMPARISON:  None. FINDINGS: Lower chest: Lung bases are clear. Hepatobiliary: No focal liver abnormality is seen. No gallstones, gallbladder wall thickening, or biliary dilatation. Pancreas: Unremarkable. No pancreatic ductal dilatation or surrounding inflammatory changes. Spleen: Normal in size without focal abnormality. Adrenals/Urinary Tract: Adrenal glands are unremarkable. Kidneys are normal, without renal calculi, focal lesion, or hydronephrosis. Bladder is unremarkable. Stomach/Bowel: Stomach is within normal limits. Appendix appears normal. No evidence of bowel wall thickening, distention, or inflammatory changes. Colonic diverticula without evidence of acute diverticulitis. Vascular/Lymphatic: Aortic atherosclerosis. No enlarged abdominal or pelvic lymph nodes. Reproductive: Status post hysterectomy. No adnexal masses. Other: No abdominal wall hernia or abnormality. No abdominopelvic ascites. Musculoskeletal: No acute or significant osseous findings. IMPRESSION: No renal or ureteral stone or obstruction. Electronically Signed   By: Burman Nieves M.D.   On: 06/26/2021 20:40   US PELVIC COMPLETE WITH TRANSVAGINAL  Result Date: 06/27/2021 CLINICAL DATA:  Severe left lower quadrant pain tonight since 6 p.m. EXAM: TRANSABDOMINAL AND  TRANSVAGINAL ULTRASOUND OF PELVIS TECHNIQUE: Both transabdominal and transvaginal ultrasound examinations of the pelvis were performed. Transabdominal technique was performed for global imaging of the pelvis including uterus, ovaries, adnexal regions, and pelvic cul-de-sac. It was necessary to proceed with endovaginal exam following the transabdominal exam to visualize the ovaries and endometrium. COMPARISON:  CT 06/26/2021 FINDINGS: Uterus Measurements: 7.2 x 2.8 x 3.3 cm = volume: 35 mL. No fibroids or other mass visualized. Endometrium Thickness: 2 mm.  No focal abnormality visualized. Right ovary Right ovary is not visualized.  No adnexal masses identified. Left ovary Left ovary is not visualized.  No adnexal masses identified. Other findings Examination is limited due to fluid-filled small bowel. Small amount of free fluid is demonstrated in the pelvis. IMPRESSION: Uterus is normal in appearance. Ovaries are not visualized but no abnormal adnexal masses are seen as imaged. Electronically Signed   By: Burman Nieves M.D.   On: 06/27/2021 00:52   CT Angio Abd/Pel W and/or Wo Contrast  Result Date: 06/27/2021 CLINICAL DATA:  Left lower quadrant pain starting suddenly tonight. Concern for acute  mesenteric ischemia. EXAM: CTA ABDOMEN AND PELVIS WITHOUT AND WITH CONTRAST TECHNIQUE: Multidetector CT imaging of the abdomen and pelvis was performed using the standard protocol during bolus administration of intravenous contrast. Multiplanar reconstructed images and MIPs were obtained and reviewed to evaluate the vascular anatomy. RADIATION DOSE REDUCTION: This exam was performed according to the departmental dose-optimization program which includes automated exposure control, adjustment of the mA and/or kV according to patient size and/or use of iterative reconstruction technique. CONTRAST:  OMNIPAQUE IOHEXOL 350 MG/ML SOLN COMPARISON:  CT renal stone study 06/26/2021 FINDINGS: VASCULAR Aorta: Normal caliber  aorta without aneurysm, dissection, vasculitis or significant stenosis. Celiac: Patent without evidence of aneurysm, dissection, vasculitis or significant stenosis. SMA: Patent without evidence of aneurysm, dissection, vasculitis or significant stenosis. Renals: Both renal arteries are patent without evidence of aneurysm, dissection, vasculitis, fibromuscular dysplasia or significant stenosis. IMA: Patent without evidence of aneurysm, dissection, vasculitis or significant stenosis. Inflow: Patent without evidence of aneurysm, dissection, vasculitis or significant stenosis. Proximal Outflow: Bilateral common femoral and visualized portions of the superficial and profunda femoral arteries are patent without evidence of aneurysm, dissection, vasculitis or significant stenosis. Veins: No obvious venous abnormality within the limitations of this arterial phase study. Review of the MIP images confirms the above findings. NON-VASCULAR Lower chest: Lung bases are clear. Hepatobiliary: No focal liver abnormality is seen. No gallstones, gallbladder wall thickening, or biliary dilatation. Pancreas: Unremarkable. No pancreatic ductal dilatation or surrounding inflammatory changes. Spleen: Normal in size without focal abnormality. Adrenals/Urinary Tract: No adrenal gland nodules. Kidneys are symmetrical with small subcentimeter cysts. No follow-up is indicated. No hydronephrosis or hydroureter. Bladder is unremarkable. Stomach/Bowel: Stomach is mostly decompressed. Left lower quadrant small bowel loops are dilated and fluid-filled with adjacent mesenteric stranding. There appears to be tapering of the mesentery to this area suggesting closed loop obstruction, possibly due to internal hernia. Mild small bowel wall thickening without pneumatosis. Colon is not abnormally distended. Colonic diverticulosis without evidence of acute diverticulitis. Appendix is normal. Lymphatic: No significant lymphadenopathy. Reproductive: Uterus and  bilateral adnexa are unremarkable. Other: Small amount of free fluid in the pelvis, developing since previous study. Musculoskeletal: No acute or significant osseous findings. IMPRESSION: VASCULAR No aortic aneurysm or dissection. No significant large vessel occlusion. Mesenteric artery and veins appear patent. NON-VASCULAR Fluid-filled dilated small bowel in the left lower quadrant with mesenteric edema and small amount of free fluid progressing since prior study. Changes are consistent with closed loop small bowel obstruction, possibly due to internal hernia or adhesion. Surgical consultation is recommended. Critical Value/emergent results were called by telephone at the time of interpretation on 06/27/2021 at 12:57 am to provider [facility] , who verbally acknowledged these results. Electronically Signed   By: Burman Nieves M.D.   On: 06/27/2021 01:04      PROCEDURES:  Critical Care performed: No  Procedures    IMPRESSION / MDM / ASSESSMENT AND PLAN / ED COURSE  I reviewed the triage vital signs and the nursing notes.  56 y.o. female with no significant past medical history who presents for evaluation of abdominal pain.  Patient arrives with sudden onset of severe sharp left lower quadrant abdominal pain associated with nausea and vomiting.  Actively vomiting and moaning in the room.  Vitals are stable, abdomen is nondistended with a left lower quadrant tenderness but no rebound or guarding  Ddx: Kidney stone versus ovarian torsion versus SBO versus bowel perforation versus mesenteric ischemia versus pyelonephritis versus UTI versus PID versus ectopic pregnancy versus  diverticulitis versus volvulus    Plan: CT, CBC, CMP, lipase, EKG, troponin, urinalysis, lactic acid.  Will give IV fentanyl, IV fluids, IV Zofran for symptom relief.  Patient placed on telemetry for monitoring of hemodynamics.   MEDICATIONS GIVEN IN ED: Medications  fentaNYL (SUBLIMAZE) injection 50 mcg (50 mcg  Intravenous Not Given 06/26/21 2309)  ondansetron (ZOFRAN) injection 4 mg (4 mg Intravenous Not Given 06/26/21 2309)  lactated ringers infusion ( Intravenous New Bag/Given 06/27/21 0119)  lactated ringers bolus 1,000 mL (0 mLs Intravenous Stopped 06/27/21 0007)  fentaNYL (SUBLIMAZE) injection 50 mcg (50 mcg Intravenous Given 06/26/21 2308)  ondansetron (ZOFRAN) injection 4 mg (4 mg Intravenous Given 06/26/21 2308)  magnesium sulfate IVPB 2 g 50 mL (0 g Intravenous Stopped 06/27/21 0020)  metoCLOPramide (REGLAN) injection 10 mg (10 mg Intravenous Given 06/27/21 0032)  iohexol (OMNIPAQUE) 350 MG/ML injection 100 mL (100 mLs Intravenous Contrast Given 06/27/21 0039)  HYDROmorphone (DILAUDID) injection 1 mg (1 mg Intravenous Given 06/27/21 0058)     ED COURSE: CT renal was done showing no signs of kidney stones or any other abnormalities.  Patient continues to look uncomfortable and actively vomiting with severe pain.  She was sent for transvaginal ultrasound to rule out ovarian torsion.  Ovaries were unable to be visualized due to several distended loops of bowel.  The lactic acid was elevated therefore she was sent for CT angio to rule out mesenteric ischemia.  The CT with contrast did show a small bowel obstruction.  NG tube will be placed since patient continues to vomit.  Labs are otherwise unremarkable with no white count, normal LFTs and lipase, no AKI or any signs of significant dehydration.  UA is negative for UTI.  EKG and troponin negative for any acute cardiac ischemia.  Hospitalist service was consulted and after discussion patient was admitted to their service.  Will initiate IV hydration with maintenance fluids.  Patient has already received a bolus of IV fluids.  Patient also received IV Reglan and Dilaudid for persistent pain and vomiting after the fentanyl and Zofran.  Discussed with Dr. Tonna Boehringer from surgery who will evaluate the patient in the ED   Consults: Hospitalist and surgery   EMR  reviewed including patient's last visit with her primary care doctor for a well woman checkup in November 2022    FINAL CLINICAL IMPRESSION(S) / ED DIAGNOSES   Final diagnoses:  LLQ abdominal pain  SBO (small bowel obstruction) (HCC)     Rx / DC Orders   ED Discharge Orders     None        Note:  This document was prepared using Dragon voice recognition software and may include unintentional dictation errors.   Please note:  Patient was evaluated in Emergency Department today for the symptoms described in the history of present illness. Patient was evaluated in the context of the global COVID-19 pandemic, which necessitated consideration that the patient might be at risk for infection with the SARS-CoV-2 virus that causes COVID-19. Institutional protocols and algorithms that pertain to the evaluation of patients at risk for COVID-19 are in a state of rapid change based on information released by regulatory bodies including the CDC and federal and state organizations. These policies and algorithms were followed during the patient's care in the ED.  Some ED evaluations and interventions may be delayed as a result of limited staffing during the pandemic.       Don Perking, [place], MD 06/27/21 Lyda Jester    Nita Sickle, MD  06/27/21 0122 ° °

## 2021-06-26 NOTE — ED Triage Notes (Signed)
Pt to ED from home c/o LLQ pain that started suddenly around 1800 tonight.  States pain radiated to left flank.  Denies n/v/d, urinary changes, or fevers.  Hx of frequent UTIs but feels different.  Pt A&Ox4, chest rise even and unlabored, skin WNL and in NAD at this time.  ?

## 2021-06-26 NOTE — ED Notes (Signed)
Pt to US.

## 2021-06-26 NOTE — ED Notes (Signed)
Pt actively vomiting - EDP @ the bedside - see orders for intervention.  ?

## 2021-06-27 ENCOUNTER — Other Ambulatory Visit: Payer: Self-pay

## 2021-06-27 ENCOUNTER — Encounter
Admission: EM | Disposition: A | Discharge status: Home / Self Care | Payer: Self-pay | Source: Home / Self Care | Attending: Surgery

## 2021-06-27 ENCOUNTER — Emergency Department: Payer: Managed Care, Other (non HMO)

## 2021-06-27 ENCOUNTER — Emergency Department: Payer: Managed Care, Other (non HMO) | Admitting: Anesthesiology

## 2021-06-27 DIAGNOSIS — K565 Intestinal adhesions [bands], unspecified as to partial versus complete obstruction: Secondary | ICD-10-CM | POA: Diagnosis present

## 2021-06-27 DIAGNOSIS — Z803 Family history of malignant neoplasm of breast: Secondary | ICD-10-CM | POA: Diagnosis not present

## 2021-06-27 DIAGNOSIS — Z20822 Contact with and (suspected) exposure to covid-19: Secondary | ICD-10-CM | POA: Diagnosis present

## 2021-06-27 DIAGNOSIS — K55029 Acute infarction of small intestine, extent unspecified: Secondary | ICD-10-CM | POA: Diagnosis present

## 2021-06-27 DIAGNOSIS — Z882 Allergy status to sulfonamides status: Secondary | ICD-10-CM | POA: Diagnosis not present

## 2021-06-27 DIAGNOSIS — K56609 Unspecified intestinal obstruction, unspecified as to partial versus complete obstruction: Secondary | ICD-10-CM | POA: Diagnosis present

## 2021-06-27 DIAGNOSIS — Z885 Allergy status to narcotic agent status: Secondary | ICD-10-CM | POA: Diagnosis not present

## 2021-06-27 DIAGNOSIS — Z800 Family history of malignant neoplasm of digestive organs: Secondary | ICD-10-CM | POA: Diagnosis not present

## 2021-06-27 DIAGNOSIS — Z888 Allergy status to other drugs, medicaments and biological substances status: Secondary | ICD-10-CM | POA: Diagnosis not present

## 2021-06-27 DIAGNOSIS — E876 Hypokalemia: Secondary | ICD-10-CM | POA: Diagnosis present

## 2021-06-27 HISTORY — PX: BOWEL RESECTION: SHX1257

## 2021-06-27 HISTORY — PX: LAPAROTOMY: SHX154

## 2021-06-27 LAB — CBC
HCT: 38.8 % (ref 36.0–46.0)
Hemoglobin: 12.9 g/dL (ref 12.0–15.0)
MCH: 28.4 pg (ref 26.0–34.0)
MCHC: 33.2 g/dL (ref 30.0–36.0)
MCV: 85.5 fL (ref 80.0–100.0)
Platelets: 269 10*3/uL (ref 150–400)
RBC: 4.54 MIL/uL (ref 3.87–5.11)
RDW: 12.6 % (ref 11.5–15.5)
WBC: 10.4 10*3/uL (ref 4.0–10.5)
nRBC: 0 % (ref 0.0–0.2)

## 2021-06-27 LAB — RESP PANEL BY RT-PCR (FLU A&B, COVID) ARPGX2
Influenza A by PCR: NEGATIVE
Influenza B by PCR: NEGATIVE
SARS Coronavirus 2 by RT PCR: NEGATIVE

## 2021-06-27 LAB — LACTIC ACID, PLASMA: Lactic Acid, Venous: 3.5 mmol/L (ref 0.5–1.9)

## 2021-06-27 LAB — GLUCOSE, CAPILLARY: Glucose-Capillary: 152 mg/dL — ABNORMAL HIGH (ref 70–99)

## 2021-06-27 LAB — CREATININE, SERUM
Creatinine, Ser: 0.72 mg/dL (ref 0.44–1.00)
GFR, Estimated: >60 mL/min (ref >60)

## 2021-06-27 SURGERY — LAPAROTOMY, EXPLORATORY
Anesthesia: General | Site: Abdomen

## 2021-06-27 MED ORDER — CEFAZOLIN SODIUM-DEXTROSE 2-4 GM/100ML-% IV SOLN
2.0000 g | Freq: Once | INTRAVENOUS | Status: AC
  Administered 2021-06-27: 2 g via INTRAVENOUS
  Filled 2021-06-27: qty 100

## 2021-06-27 MED ORDER — PANTOPRAZOLE SODIUM 40 MG IV SOLR
40.0000 mg | Freq: Every day | INTRAVENOUS | Status: DC
  Administered 2021-06-27 – 2021-06-28 (×2): 40 mg via INTRAVENOUS
  Filled 2021-06-27 (×3): qty 10

## 2021-06-27 MED ORDER — BUPIVACAINE-EPINEPHRINE (PF) 0.5% -1:200000 IJ SOLN
INTRAMUSCULAR | Status: AC
  Filled 2021-06-27: qty 30

## 2021-06-27 MED ORDER — ONDANSETRON HCL 4 MG/2ML IJ SOLN
INTRAMUSCULAR | Status: DC | PRN
  Administered 2021-06-27: 4 mg via INTRAVENOUS

## 2021-06-27 MED ORDER — SODIUM CHLORIDE FLUSH 0.9 % IV SOLN
INTRAVENOUS | Status: AC
  Filled 2021-06-27: qty 30

## 2021-06-27 MED ORDER — ACETAMINOPHEN 10 MG/ML IV SOLN
INTRAVENOUS | Status: AC
  Filled 2021-06-27: qty 100

## 2021-06-27 MED ORDER — IOHEXOL 350 MG/ML SOLN
100.0000 mL | Freq: Once | INTRAVENOUS | Status: AC | PRN
  Administered 2021-06-27: 100 mL via INTRAVENOUS

## 2021-06-27 MED ORDER — PHENYLEPHRINE 40 MCG/ML (10ML) SYRINGE FOR IV PUSH (FOR BLOOD PRESSURE SUPPORT)
PREFILLED_SYRINGE | INTRAVENOUS | Status: DC | PRN
Start: 2021-06-27 — End: 2021-06-27
  Administered 2021-06-27 (×3): 80 ug via INTRAVENOUS

## 2021-06-27 MED ORDER — BUPIVACAINE LIPOSOME 1.3 % IJ SUSP
INTRAMUSCULAR | Status: AC
  Filled 2021-06-27: qty 20

## 2021-06-27 MED ORDER — FENTANYL CITRATE (PF) 100 MCG/2ML IJ SOLN
INTRAMUSCULAR | Status: DC | PRN
  Administered 2021-06-27: 50 ug via INTRAVENOUS
  Administered 2021-06-27: 25 ug via INTRAVENOUS
  Administered 2021-06-27: 50 ug via INTRAVENOUS

## 2021-06-27 MED ORDER — PROPOFOL 500 MG/50ML IV EMUL
INTRAVENOUS | Status: AC
  Filled 2021-06-27: qty 50

## 2021-06-27 MED ORDER — FENTANYL CITRATE (PF) 100 MCG/2ML IJ SOLN
INTRAMUSCULAR | Status: AC
Start: 2021-06-27 — End: ?
  Filled 2021-06-27: qty 2

## 2021-06-27 MED ORDER — DEXAMETHASONE SODIUM PHOSPHATE 10 MG/ML IJ SOLN
INTRAMUSCULAR | Status: DC | PRN
Start: 2021-06-27 — End: 2021-06-27
  Administered 2021-06-27: 10 mg via INTRAVENOUS

## 2021-06-27 MED ORDER — ACETAMINOPHEN 325 MG PO TABS: 650.0000 mg | ORAL_TABLET | Freq: Four times a day (QID) | ORAL | Status: DC | PRN

## 2021-06-27 MED ORDER — HYDROMORPHONE HCL 1 MG/ML IJ SOLN
1.0000 mg | Freq: Once | INTRAMUSCULAR | Status: AC
  Administered 2021-06-27: 1 mg via INTRAVENOUS
  Filled 2021-06-27: qty 1

## 2021-06-27 MED ORDER — FIBRIN SEALANT 2 ML SINGLE DOSE KIT
PACK | CUTANEOUS | Status: DC | PRN
  Administered 2021-06-27: 2 mL via TOPICAL

## 2021-06-27 MED ORDER — LIDOCAINE HCL (CARDIAC) PF 100 MG/5ML IV SOSY
PREFILLED_SYRINGE | INTRAVENOUS | Status: DC | PRN
  Administered 2021-06-27: 80 mg via INTRAVENOUS

## 2021-06-27 MED ORDER — LACTATED RINGERS IV SOLN: INTRAVENOUS | Status: DC

## 2021-06-27 MED ORDER — PROPOFOL 10 MG/ML IV BOLUS
INTRAVENOUS | Status: DC | PRN
  Administered 2021-06-27: 30 mg via INTRAVENOUS
  Administered 2021-06-27: 140 mg via INTRAVENOUS

## 2021-06-27 MED ORDER — ONDANSETRON HCL 4 MG/2ML IJ SOLN: 4.0000 mg | Freq: Four times a day (QID) | INTRAMUSCULAR | Status: DC | PRN

## 2021-06-27 MED ORDER — SUGAMMADEX SODIUM 500 MG/5ML IV SOLN
INTRAVENOUS | Status: DC | PRN
  Administered 2021-06-27: 150 mg via INTRAVENOUS

## 2021-06-27 MED ORDER — 0.9 % SODIUM CHLORIDE (POUR BTL) OPTIME
TOPICAL | Status: DC | PRN
  Administered 2021-06-27: 10 mL

## 2021-06-27 MED ORDER — GABAPENTIN 300 MG PO CAPS
300.0000 mg | ORAL_CAPSULE | Freq: Two times a day (BID) | ORAL | Status: DC
  Filled 2021-06-27 (×2): qty 1

## 2021-06-27 MED ORDER — ROCURONIUM BROMIDE 100 MG/10ML IV SOLN
INTRAVENOUS | Status: DC | PRN
Start: 2021-06-27 — End: 2021-06-27
  Administered 2021-06-27: 10 mg via INTRAVENOUS

## 2021-06-27 MED ORDER — IPRATROPIUM-ALBUTEROL 0.5-2.5 (3) MG/3ML IN SOLN
RESPIRATORY_TRACT | Status: DC
Start: 2021-06-27 — End: 2021-06-27
  Filled 2021-06-27: qty 3

## 2021-06-27 MED ORDER — ONDANSETRON 4 MG PO TBDP
4.0000 mg | ORAL_TABLET | Freq: Four times a day (QID) | ORAL | Status: DC | PRN
  Filled 2021-06-27: qty 1

## 2021-06-27 MED ORDER — HYDROMORPHONE HCL 1 MG/ML IJ SOLN: 0.5000 mg | INTRAMUSCULAR | Status: DC | PRN

## 2021-06-27 MED ORDER — BUPIVACAINE LIPOSOME 1.3 % IJ SUSP
INTRAMUSCULAR | Status: DC | PRN
  Administered 2021-06-27: 50 mL via INTRAMUSCULAR

## 2021-06-27 MED ORDER — FENTANYL CITRATE (PF) 100 MCG/2ML IJ SOLN
INTRAMUSCULAR | Status: AC
  Filled 2021-06-27: qty 2

## 2021-06-27 MED ORDER — PHENYLEPHRINE HCL-NACL 20-0.9 MG/250ML-% IV SOLN
INTRAVENOUS | Status: AC
  Filled 2021-06-27: qty 250

## 2021-06-27 MED ORDER — METOCLOPRAMIDE HCL 5 MG/ML IJ SOLN
10.0000 mg | Freq: Once | INTRAMUSCULAR | Status: AC
  Administered 2021-06-27: 10 mg via INTRAVENOUS
  Filled 2021-06-27: qty 2

## 2021-06-27 MED ORDER — ACETAMINOPHEN 10 MG/ML IV SOLN
INTRAVENOUS | Status: DC | PRN
  Administered 2021-06-27: 1000 mg via INTRAVENOUS

## 2021-06-27 MED ORDER — TRAMADOL HCL 50 MG PO TABS: 50.0000 mg | ORAL_TABLET | Freq: Four times a day (QID) | ORAL | Status: DC | PRN

## 2021-06-27 MED ORDER — ENOXAPARIN SODIUM 40 MG/0.4ML IJ SOSY
40.0000 mg | PREFILLED_SYRINGE | INTRAMUSCULAR | Status: DC
  Administered 2021-06-28 – 2021-06-30 (×3): 40 mg via SUBCUTANEOUS
  Filled 2021-06-27 (×3): qty 0.4

## 2021-06-27 MED ORDER — SUCCINYLCHOLINE CHLORIDE 200 MG/10ML IV SOSY
PREFILLED_SYRINGE | INTRAVENOUS | Status: DC | PRN
  Administered 2021-06-27: 60 mg via INTRAVENOUS

## 2021-06-27 MED ORDER — PHENYLEPHRINE HCL-NACL 20-0.9 MG/250ML-% IV SOLN
INTRAVENOUS | Status: DC | PRN
  Administered 2021-06-27: 20 ug/min via INTRAVENOUS

## 2021-06-27 MED ORDER — MIDAZOLAM HCL 2 MG/2ML IJ SOLN
INTRAMUSCULAR | Status: AC
Start: 2021-06-27 — End: ?
  Filled 2021-06-27: qty 2

## 2021-06-27 SURGICAL SUPPLY — 55 items
BLADE CLIPPER SURG (BLADE) ×1 IMPLANT
CHLORAPREP W/TINT 26 (MISCELLANEOUS) ×1 IMPLANT
DRAPE LAPAROTOMY 100X77 ABD (DRAPES) ×2 IMPLANT
DRSG OPSITE POSTOP 4X10 (GAUZE/BANDAGES/DRESSINGS) ×1 IMPLANT
DRSG OPSITE POSTOP 4X12 (GAUZE/BANDAGES/DRESSINGS) IMPLANT
DRSG OPSITE POSTOP 4X8 (GAUZE/BANDAGES/DRESSINGS) ×1 IMPLANT
ELECT BLADE 6 FLAT ULTRCLN (ELECTRODE) IMPLANT
ELECT BLADE 6.5 EXT (BLADE) ×2 IMPLANT
ELECT CAUTERY BLADE 6.4 (BLADE) ×2 IMPLANT
ELECT REM PT RETURN 9FT ADLT (ELECTROSURGICAL) ×2
ELECTRODE REM PT RTRN 9FT ADLT (ELECTROSURGICAL) ×1 IMPLANT
GAUZE 4X4 16PLY ~~LOC~~+RFID DBL (SPONGE) ×1 IMPLANT
GLOVE SURG SYN 6.5 ES PF (GLOVE) ×10 IMPLANT
GLOVE SURG SYN 6.5 PF PI (GLOVE) ×2 IMPLANT
GLOVE SURG UNDER POLY LF SZ7 (GLOVE) ×3 IMPLANT
GOWN STRL REUS W/ TWL LRG LVL3 (GOWN DISPOSABLE) ×3 IMPLANT
GOWN STRL REUS W/TWL LRG LVL3 (GOWN DISPOSABLE) ×2
HANDLE SUCTION POOLE (INSTRUMENTS) IMPLANT
KIT TURNOVER KIT A (KITS) ×2 IMPLANT
LABEL OR SOLS (LABEL) ×2 IMPLANT
LIGASURE IMPACT 36 18CM CVD LR (INSTRUMENTS) ×1 IMPLANT
MANIFOLD NEPTUNE II (INSTRUMENTS) ×2 IMPLANT
NEEDLE HYPO 22GX1.5 SAFETY (NEEDLE) ×1 IMPLANT
NS IRRIG 1000ML POUR BTL (IV SOLUTION) ×1 IMPLANT
PACK BASIN MAJOR ARMC (MISCELLANEOUS) ×2 IMPLANT
PACK COLON CLEAN CLOSURE (MISCELLANEOUS) ×1 IMPLANT
RELOAD LINEAR CUT PROX 55 BLUE (ENDOMECHANICALS) IMPLANT
RELOAD PROXIMATE 75MM BLUE (ENDOMECHANICALS) ×6 IMPLANT
RELOAD STAPLE 55 3.8 BLU REG (ENDOMECHANICALS) IMPLANT
RELOAD STAPLE 75 3.8 BLU REG (ENDOMECHANICALS) IMPLANT
RELOAD STAPLER LINEAR PROX 30 (STAPLE) IMPLANT
SPONGE T-LAP 18X18 ~~LOC~~+RFID (SPONGE) ×8 IMPLANT
STAPLER PROXIMATE 55 BLUE (STAPLE) IMPLANT
STAPLER PROXIMATE 75MM BLUE (STAPLE) ×1 IMPLANT
STAPLER RELOAD LINEAR PROX 30 (STAPLE)
STAPLER RELOADABLE 30 BLU REG (STAPLE) IMPLANT
STAPLER SKIN PROX 35W (STAPLE) ×2 IMPLANT
SUCTION POOLE HANDLE (INSTRUMENTS)
SUT PDS AB 1 TP1 54 (SUTURE) ×4 IMPLANT
SUT PROLENE 3 0 PS 2 (SUTURE) IMPLANT
SUT SILK 2 0 (SUTURE) ×1
SUT SILK 2-0 18XBRD TIE 12 (SUTURE) ×1 IMPLANT
SUT SILK 3 0 (SUTURE) ×1
SUT SILK 3 0 REEL (SUTURE) IMPLANT
SUT SILK 3-0 (SUTURE) ×2 IMPLANT
SUT SILK 3-0 18XBRD TIE 12 (SUTURE) ×1 IMPLANT
SUT VIC AB 0 CT1 18XCR BRD 8 (SUTURE) IMPLANT
SUT VIC AB 0 CT1 8-18 (SUTURE)
SUT VIC AB 3-0 SH 27 (SUTURE) ×2
SUT VIC AB 3-0 SH 27X BRD (SUTURE) ×2 IMPLANT
SYR 20ML LL LF (SYRINGE) ×2 IMPLANT
TOWEL OR 17X26 4PK STRL BLUE (TOWEL DISPOSABLE) ×1 IMPLANT
TRAY FOLEY MTR SLVR 16FR STAT (SET/KITS/TRAYS/PACK) ×2 IMPLANT
TRAY FOLEY SLVR 16FR LF STAT (SET/KITS/TRAYS/PACK) IMPLANT
WATER STERILE IRR 500ML POUR (IV SOLUTION) ×2 IMPLANT

## 2021-06-27 NOTE — Op Note (Signed)
Preoperative diagnosis: Closed-loop small bowel obstruction ? ?Postoperative diagnosis: Same ? ?Procedure: Exploratory laparotomy small bowel resection and reanastomosis ?Anesthesia: GETA ?  ?Surgeon: Benjamine Sprague ? ?  ?Wound Classification: clean contaminated ?  ?Specimen: Small bowel ?  ?Complications: None ?  ?Estimated Blood Loss: 50 mL ? ? ?Indications: Please see H&P for further details.   ?  ?FIndings: ?1.  Closed-loop obstruction secondary to adhesive band with necrotic small bowel ?2.  Adequate hemostasis.  ?   ?Description of procedure: The patient was placed on the operating table in the supine position, General anesthesia was induced. A time-out was completed verifying correct patient, procedure, site, positioning, and implant(s) and/or special equipment prior to beginning this procedure. The abdomen was prepped and draped in the usual sterile fashion.  ?  ?Periumbilical midline incision made and dissection carried down to the fascia and peritoneal cavity entered. Examination of the abdominal cavity single adhesive band causing a closed-loop obstruction of a segment of small bowel.  Small bowel was obviously necrotic.  Decision was made at this point to proceed with resection and reanastomosis.  Adhesive band removed and the small bowel unraveled.  Remainder of the small bowel was run from ligament of Treitz to terminal ileum and no additional pathology was noted.  Point made just proximal and distal to the necrotic bowel and a 75 mm blue load GIA stapler was used to transect the bowel.  LigaSure was then used to transect the mesentery in between and the specimen was passed off field pending pathology.   ? ?The 2 stapled ends were then aligned together with no twisting of the mesentery.  Enterotomies made accommodate an additional 75 mm blue load stapler to create a side-to-side anastomosis.  Enterotomy was then closed with a third blue load stapler.  Staple line was then reinforced with 3-0 Vicryl in a  Lembert fashion.  The mesenteric defect was also closed with a 3-0 Vicryl.   ?  ? No bleeding or additional pathology was noted.  Abdomen extensively irrigated, NG placement confirmed manually, and then midline incision closed with 1 PDS x2 skin approximated using 3-0 Vicryl in interrupted fashion and then staples.  The patient tolerated the procedure well, awakened from anesthesia and was taken to the postanesthesia care unit in satisfactory condition.  Foley still in place.  Sponge count and instrument count correct at the end of the procedure. ?  ?  ? ?

## 2021-06-27 NOTE — ED Notes (Signed)
MD Damita Dunnings) present at the bedside. ?

## 2021-06-27 NOTE — Anesthesia Postprocedure Evaluation (Signed)
Anesthesia Post Note ? ?Patient: Kari Guerrero ? ?Procedure(s) Performed: EXPLORATORY LAPAROTOMY (Abdomen) ?SMALL BOWEL RESECTION (Abdomen) ? ?Patient location during evaluation: PACU ?Anesthesia Type: General ?Level of consciousness: awake and alert, oriented and patient cooperative ?Pain management: pain level controlled ?Vital Signs Assessment: post-procedure vital signs reviewed and stable ?Respiratory status: spontaneous breathing, nonlabored ventilation and respiratory function stable ?Cardiovascular status: blood pressure returned to baseline and stable ?Postop Assessment: adequate PO intake ?Anesthetic complications: no ? ? ?No notable events documented. ? ? ?Last Vitals:  ?Vitals:  ? 06/27/21 0515 06/27/21 0534  ?BP: 121/77 115/73  ?Pulse: 98 96  ?Resp: 15 13  ?Temp: 36.6 ?C 36.6 ?C  ?SpO2: 98% 97%  ?  ?Last Pain:  ?Vitals:  ? 06/27/21 0515  ?TempSrc:   ?PainSc: 2   ? ? ?  ?  ?  ?  ?  ?  ? ?Darrin Nipper ? ? ? ? ?

## 2021-06-27 NOTE — ED Notes (Signed)
Pts clothing removed and placed in a belongings bag. Pts jewelry as follows placed in a specimen bag and given to Spouse.  ? ?2 earrings  ?Yellow colored necklace ?Blue watch  ?3 gray rings.  ?

## 2021-06-27 NOTE — ED Notes (Signed)
Pt to CT

## 2021-06-27 NOTE — Anesthesia Procedure Notes (Signed)
Procedure Name: Intubation ?Date/Time: 06/27/2021 2:54 AM ?Performed by: Lendon Colonel, CRNA ?Pre-anesthesia Checklist: Patient identified, Patient being monitored, Timeout performed, Emergency Drugs available and Suction available ?Patient Re-evaluated:Patient Re-evaluated prior to induction ?Oxygen Delivery Method: Circle system utilized ?Preoxygenation: Pre-oxygenation with 100% oxygen ?Induction Type: IV induction ?Ventilation: Mask ventilation without difficulty ?Laryngoscope Size: Mac and 3 ?Grade View: Grade I ?Tube type: Oral ?Tube size: 7.0 mm ?Number of attempts: 1 ?Airway Equipment and Method: Stylet ?Placement Confirmation: ETT inserted through vocal cords under direct vision, positive ETCO2 and breath sounds checked- equal and bilateral ?Secured at: 20 cm ?Tube secured with: Tape ?Dental Injury: Teeth and Oropharynx as per pre-operative assessment  ? ? ? ? ?

## 2021-06-27 NOTE — ED Notes (Signed)
Pt back from CT, tearful and visibly uncomfortable, EDP notified- see MAR for intervention.  ?

## 2021-06-27 NOTE — Consult Note (Signed)
Subjective:  ? ?CC: closed loop obstruction ? ?HPI: ? Kari Guerrero is a 56 y.o. female who was consulted by Alfred Levins for issue above.  Symptoms were first noted 7 hours ago. Pain is sharp, sudden onset, confined to the lower abdomen, without radiation.  Associated with N/V, exacerbated by nothing specific   ?  ?Past Medical History:  has a past medical history of Dysuria and UTI (lower urinary tract infection). ? ?Past Surgical History:  ?Past Surgical History:  ?Procedure Laterality Date  ? CESAREAN SECTION    ? ? ?Family History: family history includes Breast cancer in her paternal aunt; Breast cancer (age of onset: 33) in her maternal grandmother; Colon cancer in her father. ? ?Social History:  reports that she has never smoked. She has never used smokeless tobacco. She reports that she does not drink alcohol and does not use drugs. ? ?Current Medications:  ?Prior to Admission medications   ?Medication Sig Start Date End Date Taking? Authorizing Provider  ?Multiple Vitamin (MULTI-VITAMINS) TABS Take 1 tablet by mouth daily.   Yes [provider]  ?NON FORMULARY Estriol 1 mg/g Use 1 g per vagina twice weekly Disp 30 g tube with 2 refills 02/26/21  Yes [provider]  ?ciprofloxacin (CIPRO) 500 MG tablet Take 1 tablet (500 mg total) by mouth every 12 (twelve) hours. ?Patient not taking: Reported on 05/09/2020 12/22/15   Nori Riis, PA-C  ?conjugated estrogens (PREMARIN) vaginal cream Place 1 Applicatorful vaginally daily. Apply blueberry sized amount of cream to urethra (vaginal opening) 3 times per week at bedtime. ?Patient not taking: Reported on 05/08/2015 02/05/15   Roda Shutters, FNP  ? ? ?Allergies:  ?Allergies as of 06/26/2021 - Review Complete 06/26/2021  ?Allergen Reaction Noted  ? Codeine  02/05/2015  ? Macrobid [nitrofurantoin monohyd macro]  02/05/2015  ? Sulfa antibiotics  02/05/2015  ? ? ?ROS:  ?General: Denies weight loss, weight gain, fatigue, fevers, chills, and night  sweats. ?Eyes: Denies blurry vision, double vision, eye pain, itchy eyes, and tearing. ?Ears: Denies hearing loss, earache, and ringing in ears. ?Nose: Denies sinus pain, congestion, infections, runny nose, and nosebleeds. ?Mouth/throat: Denies hoarseness, sore throat, bleeding gums, and difficulty swallowing. ?Heart: Denies chest pain, palpitations, racing heart, irregular heartbeat, leg pain or swelling, and decreased activity tolerance. ?Respiratory: Denies breathing difficulty, shortness of breath, wheezing, cough, and sputum. ?GI: Denies change in appetite, heartburn, constipation, diarrhea, and blood in stool. ?GU: Denies difficulty urinating, pain with urinating, urgency, frequency, blood in urine. ?Musculoskeletal: Denies joint stiffness, pain, swelling, muscle weakness. ?Skin: Denies rash, itching, mass, tumors, sores, and boils ?Neurologic: Denies headache, fainting, dizziness, seizures, numbness, and tingling. ?Psychiatric: Denies depression, anxiety, difficulty sleeping, and memory loss. ?Endocrine: Denies heat or cold intolerance, and increased thirst or urination. ?Blood/lymph: Denies easy bruising, easy bruising, and swollen glands ? ? ?  ?Objective:  ?  ? ?BP 130/89   Pulse 75   Temp 98.4 ?F (36.9 ?C) (Oral)   Resp (!) 25   Ht '5\' 4"'$  (1.626 m)   Wt 65.8 kg   SpO2 100%   BMI 24.89 kg/m?  ? ?Constitutional :  alert, cooperative, appears stated age, and no distress  ?Lymphatics/Throat:  no asymmetry, masses, or scars  ?Respiratory:  clear to auscultation bilaterally  ?Cardiovascular:  regular rate and rhythm  ?Gastrointestinal: Focal TTP lower abdomen .   ?Musculoskeletal: Steady movement  ?Skin: Cool and moist, c-section surgical scar  ?Psychiatric: Normal affect, non-agitated, not confused  ?   ?  ?  LABS:  ?CMP Latest Ref Rng & Units 06/26/2021 12/30/2013  ?Glucose 70 - 99 mg/dL 148(H) 109(H)  ?BUN 6 - 20 mg/dL 24(H) 19(H)  ?Creatinine 0.44 - 1.00 mg/dL 0.78 0.82  ?Sodium 135 - 145 mmol/L 136 137   ?Potassium 3.5 - 5.1 mmol/L 3.9 3.6  ?Chloride 98 - 111 mmol/L 100 107  ?CO2 22 - 32 mmol/L 28 24  ?Calcium 8.9 - 10.3 mg/dL 9.1 8.7  ?Total Protein 6.5 - 8.1 g/dL 7.5 8.1  ?Total Bilirubin 0.3 - 1.2 mg/dL 0.8 0.7  ?Alkaline Phos 38 - 126 U/L 47 69  ?AST 15 - 41 U/L 21 31  ?ALT 0 - 44 U/L 18 20  ? ?CBC Latest Ref Rng & Units 06/26/2021 12/30/2013  ?WBC 4.0 - 10.5 K/uL 8.8 8.1  ?Hemoglobin 12.0 - 15.0 g/dL 12.6 13.4  ?Hematocrit 36.0 - 46.0 % 39.0 41.0  ?Platelets 150 - 400 K/uL 245 228  ? ? ?RADS: ?CLINICAL DATA:  Left lower quadrant pain starting suddenly tonight. ?Concern for acute mesenteric ischemia. ?  ?EXAM: ?CTA ABDOMEN AND PELVIS WITHOUT AND WITH CONTRAST ?  ?TECHNIQUE: ?Multidetector CT imaging of the abdomen and pelvis was performed ?using the standard protocol during bolus administration of ?intravenous contrast. Multiplanar reconstructed images and MIPs were ?obtained and reviewed to evaluate the vascular anatomy. ?  ?RADIATION DOSE REDUCTION: This exam was performed according to the ?departmental dose-optimization program which includes automated ?exposure control, adjustment of the mA and/or kV according to ?patient size and/or use of iterative reconstruction technique. ?  ?CONTRAST:  172m OMNIPAQUE IOHEXOL 350 MG/ML SOLN ?  ?COMPARISON:  CT renal stone study 06/26/2021 ?  ?FINDINGS: ?VASCULAR ?  ?Aorta: Normal caliber aorta without aneurysm, dissection, vasculitis ?or significant stenosis. ?  ?Celiac: Patent without evidence of aneurysm, dissection, vasculitis ?or significant stenosis. ?  ?SMA: Patent without evidence of aneurysm, dissection, vasculitis or ?significant stenosis. ?  ?Renals: Both renal arteries are patent without evidence of aneurysm, ?dissection, vasculitis, fibromuscular dysplasia or significant ?stenosis. ?  ?IMA: Patent without evidence of aneurysm, dissection, vasculitis or ?significant stenosis. ?  ?Inflow: Patent without evidence of aneurysm, dissection, vasculitis ?or  significant stenosis. ?  ?Proximal Outflow: Bilateral common femoral and visualized portions ?of the superficial and profunda femoral arteries are patent without ?evidence of aneurysm, dissection, vasculitis or significant ?stenosis. ?  ?Veins: No obvious venous abnormality within the limitations of this ?arterial phase study. ?  ?Review of the MIP images confirms the above findings. ?  ?NON-VASCULAR ?  ?Lower chest: Lung bases are clear. ?  ?Hepatobiliary: No focal liver abnormality is seen. No gallstones, ?gallbladder wall thickening, or biliary dilatation. ?  ?Pancreas: Unremarkable. No pancreatic ductal dilatation or ?surrounding inflammatory changes. ?  ?Spleen: Normal in size without focal abnormality. ?  ?Adrenals/Urinary Tract: No adrenal gland nodules. Kidneys are ?symmetrical with small subcentimeter cysts. No follow-up is ?indicated. No hydronephrosis or hydroureter. Bladder is ?unremarkable. ?  ?Stomach/Bowel: Stomach is mostly decompressed. Left lower quadrant ?small bowel loops are dilated and fluid-filled with adjacent ?mesenteric stranding. There appears to be tapering of the mesentery ?to this area suggesting closed loop obstruction, possibly due to ?internal hernia. Mild small bowel wall thickening without ?pneumatosis. Colon is not abnormally distended. Colonic ?diverticulosis without evidence of acute diverticulitis. Appendix is ?normal. ?  ?Lymphatic: No significant lymphadenopathy. ?  ?Reproductive: Uterus and bilateral adnexa are unremarkable. ?  ?Other: Small amount of free fluid in the pelvis, developing since ?previous study. ?  ?Musculoskeletal: No acute or significant  osseous findings. ?  ?IMPRESSION: ?VASCULAR ?  ?No aortic aneurysm or dissection. No significant large vessel ?occlusion. Mesenteric artery and veins appear patent. ?  ?NON-VASCULAR ?  ?Fluid-filled dilated small bowel in the left lower quadrant with ?mesenteric edema and small amount of free fluid progressing since ?prior  study. Changes are consistent with closed loop small bowel ?obstruction, possibly due to internal hernia or adhesion. Surgical ?consultation is recommended. ?  ?Critical Value/emergent results were called by telephone

## 2021-06-27 NOTE — ED Notes (Signed)
Pt back from US

## 2021-06-27 NOTE — TOC Initial Note (Signed)
Transition of Care (TOC) - Initial/Assessment Note  ? ? ?Patient Details  ?Name: Kari Guerrero ?MRN: [id number] ?Date of Birth: [birth year] ? ?Transition of Care (TOC) CM/SW Contact:    ?Conception Oms, RN ?Phone Number: ?06/27/2021, 9:07 AM ? ?Clinical Narrative:      ?Transition of Care (TOC) Screening Note ? ? ?Patient Details  ?Name: Kari Guerrero ?Date of Birth: 29-Jun-1965 ? ? ?Transition of Care (TOC) CM/SW Contact:    ?Conception Oms, RN ?Phone Number: ?06/27/2021, 9:07 AM ? ? ? ?Transition of Care Department [facility]) has reviewed patient and no TOC needs have been identified at this time. We will continue to monitor patient advancement through interdisciplinary progression rounds. If new patient transition needs arise, please place a TOC consult. ?             ? ? ?  ?  ? ? ?Patient Goals and CMS Choice ?  ?  ?  ? ?Expected Discharge Plan and Services ?  ?  ?  ?  ?  ?                ?  ?  ?  ?  ?  ?  ?  ?  ?  ?  ? ?Prior Living Arrangements/Services ?  ?  ?  ?       ?  ?  ?  ?  ? ?Activities of Daily Living ?Home Assistive Devices/Equipment: None ?ADL Screening (condition at time of admission) ?Patient's cognitive ability adequate to safely complete daily activities?: Yes ?Is the patient deaf or have difficulty hearing?: No ?Does the patient have difficulty seeing, even when wearing glasses/contacts?: Yes (Up close (tri focals)) ?Does the patient have difficulty concentrating, remembering, or making decisions?: No ?Patient able to express need for assistance with ADLs?: Yes ?Does the patient have difficulty dressing or bathing?: No ?Independently performs ADLs?: Yes (appropriate for developmental age) ?Does the patient have difficulty walking or climbing stairs?: No ?Weakness of Legs: None ?Weakness of Arms/Hands: None ? ?Permission Sought/Granted ?  ?  ?   ?   ?   ?   ? ?Emotional Assessment ?  ?  ?  ?  ?  ?  ? ?Admission diagnosis:  SBO (small bowel obstruction) ([place]) [K56.609] ?LLQ abdominal pain  [R10.32] ?Encounter for nasogastric (NG) tube placement [Z46.59] ?Patient Active Problem List  ? Diagnosis Date Noted  ? SBO (small bowel obstruction) ([place]) 06/27/2021  ? ?PCP:  Schermerhorn, Gwen Her, MD ?Pharmacy:   ?[id number] [address] ?[address] ?[place] [address] ?Phone: [phone] Fax: [phone] ? ? ? ? ?Social Determinants of Health (SDOH) Interventions ?  ? ?Readmission Risk Interventions ?No flowsheet data found. ? ? ?

## 2021-06-27 NOTE — Anesthesia Preprocedure Evaluation (Addendum)
Anesthesia Evaluation  ?Patient identified by MRN, date of birth, ID band ?Patient awake ? ? ? ?Reviewed: ?Allergy & Precautions, NPO status , Patient's Chart, lab work & pertinent test results ? ?History of Anesthesia Complications ?Negative for: history of anesthetic complications ? ?Airway ?Mallampati: IV ? ? ?Neck ROM: Full ? ? ? Dental ?no notable dental hx. ? ?  ?Pulmonary ?neg pulmonary ROS,  ?  ?Pulmonary exam normal ?breath sounds clear to auscultation ? ? ? ? ? ? Cardiovascular ?Exercise Tolerance: Good ?negative cardio ROS ?Normal cardiovascular exam ?Rhythm:Regular Rate:Normal ? ?ECG 06/26/21: NSR ?  ?Neuro/Psych ?negative neurological ROS ?   ? GI/Hepatic ?SBO ?  ?Endo/Other  ?negative endocrine ROS ? Renal/GU ?negative Renal ROS  ? ?  ?Musculoskeletal ? ? Abdominal ?  ?Peds ? Hematology ?negative hematology ROS ?(+)   ?Anesthesia Other Findings ? ? Reproductive/Obstetrics ? ?  ? ? ? ? ? ? ? ? ? ? ? ? ? ?  ?  ? ? ? ? ? ? ? ?Anesthesia Physical ?Anesthesia Plan ? ?ASA: 2 and emergent ? ?Anesthesia Plan: General  ? ?Post-op Pain Management:   ? ?Induction: Intravenous and Rapid sequence ? ?PONV Risk Score and Plan: 3 and Ondansetron, Dexamethasone and Treatment may vary due to age or medical condition ? ?Airway Management Planned: Oral ETT ? ?Additional Equipment:  ? ?Intra-op Plan:  ? ?Post-operative Plan: Extubation in OR ? ?Informed Consent: I have reviewed the patients History and Physical, chart, labs and discussed the procedure including the risks, benefits and alternatives for the proposed anesthesia with the patient or authorized representative who has indicated his/her understanding and acceptance.  ? ? ? ?Dental advisory given ? ?Plan Discussed with: CRNA ? ?Anesthesia Plan Comments: (Patient consented for risks of anesthesia including but not limited to:  ?- adverse reactions to medications ?- damage to eyes, teeth, lips or other oral mucosa ?- nerve damage due  to positioning  ?- sore throat or hoarseness ?- damage to heart, brain, nerves, lungs, other parts of body or loss of life ? ?Informed patient about role of CRNA in peri- and intra-operative care.  Patient voiced understanding.)  ? ? ? ? ? ? ?Anesthesia Quick Evaluation ? ?

## 2021-06-27 NOTE — Plan of Care (Signed)

## 2021-06-27 NOTE — Transfer of Care (Signed)
Immediate Anesthesia Transfer of Care Note ? ?Patient: Kari Guerrero ? ?Procedure(s) Performed: EXPLORATORY LAPAROTOMY (Abdomen) ?SMALL BOWEL RESECTION (Abdomen) ? ?Patient Location: PACU ? ?Anesthesia Type:General ? ?Level of Consciousness: sedated and patient cooperative ? ?Airway & Oxygen Therapy: Patient Spontanous Breathing and Patient connected to face mask oxygen ? ?Post-op Assessment: Report given to RN and Post -op Vital signs reviewed and stable ? ?Post vital signs: Reviewed and stable ? ?Last Vitals:  ?Vitals Value Taken Time  ?BP    ?Temp    ?Pulse    ?Resp    ?SpO2    ? ? ?Last Pain:  ?Vitals:  ? 06/27/21 0119  ?TempSrc:   ?PainSc: 0-No pain  ?   ? ?  ? ?Complications: No notable events documented. ?

## 2021-06-27 NOTE — ED Notes (Signed)
MD Lysle Pearl) @ the bedside. ?

## 2021-06-28 ENCOUNTER — Encounter: Payer: Self-pay | Admitting: Surgery

## 2021-06-28 LAB — BASIC METABOLIC PANEL
Anion gap: 11 (ref 5–15)
BUN: 16 mg/dL (ref 6–20)
CO2: 25 mmol/L (ref 22–32)
Calcium: 8.6 mg/dL — ABNORMAL LOW (ref 8.9–10.3)
Chloride: 103 mmol/L (ref 98–111)
Creatinine, Ser: 0.76 mg/dL (ref 0.44–1.00)
GFR, Estimated: >60 mL/min (ref >60)
Glucose, Bld: 113 mg/dL — ABNORMAL HIGH (ref 70–99)
Potassium: 3.6 mmol/L (ref 3.5–5.1)
Sodium: 139 mmol/L (ref 135–145)

## 2021-06-28 LAB — CBC
HCT: 35.4 % — ABNORMAL LOW (ref 36.0–46.0)
Hemoglobin: 11.5 g/dL — ABNORMAL LOW (ref 12.0–15.0)
MCH: 28.3 pg (ref 26.0–34.0)
MCHC: 32.5 g/dL (ref 30.0–36.0)
MCV: 87 fL (ref 80.0–100.0)
Platelets: 268 10*3/uL (ref 150–400)
RBC: 4.07 MIL/uL (ref 3.87–5.11)
RDW: 13 % (ref 11.5–15.5)
WBC: 10.2 10*3/uL (ref 4.0–10.5)
nRBC: 0 % (ref 0.0–0.2)

## 2021-06-28 LAB — SURGICAL PATHOLOGY

## 2021-06-28 NOTE — Plan of Care (Signed)

## 2021-06-28 NOTE — Progress Notes (Signed)
Subjective:  ?CC: ?Kari Guerrero is a 56 y.o. female  Hospital stay day 1, 1 Day Post-Op SB resection for closed loop obstruction ? ?HPI: ?No issues overnight.  Passing flatus now. ? ?ROS:  ?General: Denies weight loss, weight gain, fatigue, fevers, chills, and night sweats. ?Heart: Denies chest pain, palpitations, racing heart, irregular heartbeat, leg pain or swelling, and decreased activity tolerance. ?Respiratory: Denies breathing difficulty, shortness of breath, wheezing, cough, and sputum. ?GI: Denies change in appetite, heartburn, nausea, vomiting, constipation, diarrhea, and blood in stool. ?GU: Denies difficulty urinating, pain with urinating, urgency, frequency, blood in urine. ? ? ?Objective:  ? ?Temp:  [98.5 ?F (36.9 ?C)-99.6 ?F (37.6 ?C)] 98.5 ?F (36.9 ?C) (03/17 6468) ?Pulse Rate:  [80-102] 80 (03/17 0834) ?Resp:  [18-20] 18 (03/17 0834) ?BP: (120-128)/(71-84) 120/74 (03/17 0834) ?SpO2:  [97 %-98 %] 97 % (03/17 0834)     Height: '5\' 4"'$  (162.6 cm) Weight: 65.8 kg BMI (Calculated): 24.88  ? ?Intake/Output this shift:  ? ?Intake/Output Summary (Last 24 hours) at 06/28/2021 1317 ?Last data filed at 06/28/2021 0900 ?Gross per 24 hour  ?Intake 463.05 ml  ?Output 1100 ml  ?Net -636.95 ml  ? ? ?Constitutional :  alert, cooperative, appears stated age, and no distress  ?Respiratory:  clear to auscultation bilaterally  ?Cardiovascular:  regular rate and rhythm  ?Gastrointestinal: soft, non-tender; bowel sounds normal; no masses,  no organomegaly. NG with bilious output  ?Skin: Cool and moist. Staple line C/D/I  ?Psychiatric: Normal affect, non-agitated, not confused  ?   ?  ?LABS:  ?CMP Latest Ref Rng & Units 06/28/2021 06/27/2021 06/26/2021  ?Glucose 70 - 99 mg/dL 113(H) - 148(H)  ?BUN 6 - 20 mg/dL 16 - 24(H)  ?Creatinine 0.44 - 1.00 mg/dL 0.76 0.72 0.78  ?Sodium 135 - 145 mmol/L 139 - 136  ?Potassium 3.5 - 5.1 mmol/L 3.6 - 3.9  ?Chloride 98 - 111 mmol/L 103 - 100  ?CO2 22 - 32 mmol/L 25 - 28  ?Calcium 8.9 - 10.3  mg/dL 8.6(L) - 9.1  ?Total Protein 6.5 - 8.1 g/dL - - 7.5  ?Total Bilirubin 0.3 - 1.2 mg/dL - - 0.8  ?Alkaline Phos 38 - 126 U/L - - 47  ?AST 15 - 41 U/L - - 21  ?ALT 0 - 44 U/L - - 18  ? ?CBC Latest Ref Rng & Units 06/28/2021 06/27/2021 06/26/2021  ?WBC 4.0 - 10.5 K/uL 10.2 10.4 8.8  ?Hemoglobin 12.0 - 15.0 g/dL 11.5(L) 12.9 12.6  ?Hematocrit 36.0 - 46.0 % 35.4(L) 38.8 39.0  ?Platelets 150 - 400 K/uL 268 269 245  ? ? ?RADS: ?N/a ?Assessment:  ? ?S/p SB resection for closed loop obstruction. ? ?Clamp trial today.  Foley pulled yesterday and voidning.  Clears if she passes clamp trial ? ?labs/images/medications/previous chart entries reviewed personally and relevant changes/updates noted above. ? ? ?

## 2021-06-29 LAB — BASIC METABOLIC PANEL
Anion gap: 9 (ref 5–15)
BUN: 15 mg/dL (ref 6–20)
CO2: 26 mmol/L (ref 22–32)
Calcium: 8.5 mg/dL — ABNORMAL LOW (ref 8.9–10.3)
Chloride: 102 mmol/L (ref 98–111)
Creatinine, Ser: 0.75 mg/dL (ref 0.44–1.00)
GFR, Estimated: >60 mL/min (ref >60)
Glucose, Bld: 89 mg/dL (ref 70–99)
Potassium: 3.4 mmol/L — ABNORMAL LOW (ref 3.5–5.1)
Sodium: 137 mmol/L (ref 135–145)

## 2021-06-29 LAB — CBC
HCT: 34.8 % — ABNORMAL LOW (ref 36.0–46.0)
Hemoglobin: 11.2 g/dL — ABNORMAL LOW (ref 12.0–15.0)
MCH: 28.7 pg (ref 26.0–34.0)
MCHC: 32.2 g/dL (ref 30.0–36.0)
MCV: 89.2 fL (ref 80.0–100.0)
Platelets: 239 10*3/uL (ref 150–400)
RBC: 3.9 MIL/uL (ref 3.87–5.11)
RDW: 12.7 % (ref 11.5–15.5)
WBC: 7.8 10*3/uL (ref 4.0–10.5)
nRBC: 0 % (ref 0.0–0.2)

## 2021-06-29 MED ORDER — POTASSIUM CHLORIDE CRYS ER 20 MEQ PO TBCR
40.0000 meq | EXTENDED_RELEASE_TABLET | Freq: Once | ORAL | Status: AC
Start: 2021-06-29 — End: 2021-06-29
  Administered 2021-06-29: 40 meq via ORAL
  Filled 2021-06-29: qty 2

## 2021-06-29 NOTE — Progress Notes (Signed)
06/29/2021 ? ?Subjective: ?Patient is 2 Days Post-Op status post exploratory laparotomy with small bowel resection for closed-loop small bowel obstruction on 06/27/2021 with Dr. Lysle Pearl.  No acute events overnight.  Patient reports that she feels a lot better compared to before surgery.  Denies any nausea.  Her NG tube was removed yesterday and she has tolerated clear liquid diet.  She continues having flatus but no bowel movement yet. ? ?Vital signs: ?Temp:  [98.4 ?F (36.9 ?C)-100 ?F (37.8 ?C)] 98.8 ?F (37.1 ?C) (03/18 0736) ?Pulse Rate:  [82-92] 82 (03/18 0736) ?Resp:  [16-18] 17 (03/18 0736) ?BP: (100-134)/(68-82) 100/69 (03/18 0736) ?SpO2:  [96 %-98 %] 96 % (03/18 0736)  ? ?Intake/Output: ?03/17 0701 - 03/18 0700 ?In: -  ?Out: 300 [Urine:300] ?Last BM Date : 06/26/21 ? ?Physical Exam: ?Constitutional: No acute distress ?Abdomen: Soft, nondistended, appropriately tender to palpation.  Midline incision is clean, dry, intact with staples in place. ? ?Labs:  ?Recent Labs  ?  06/28/21 ?4034 06/29/21 ?0436  ?WBC 10.2 7.8  ?HGB 11.5* 11.2*  ?HCT 35.4* 34.8*  ?PLT 268 239  ? ?Recent Labs  ?  06/28/21 ?7425 06/29/21 ?0436  ?NA 139 137  ?K 3.6 3.4*  ?CL 103 102  ?CO2 25 26  ?GLUCOSE 113* 89  ?BUN 16 15  ?CREATININE 0.76 0.75  ?CALCIUM 8.6* 8.5*  ? ?No results for input(s): LABPROT, INR in the last 72 hours. ? ?Imaging: ?No results found. ? ?Assessment/Plan: ?This is a 56 y.o. female s/p exploratory laparotomy with small bowel resection. ? ?- Patient is doing very well and improving appropriately after surgery.  She has tolerated clear liquid diet and continues having bowel function.  We will advance her to a full liquid diet today. ?-Her potassium level was a little bit low today to 3.4 and I have repleted her hypokalemia with p.o. potassium.  We will repeat labs tomorrow to reevaluate. ?- Encouraged ambulation and be out of bed. ?- If she tolerates full liquid diet today, should be able to advance further tomorrow and  potentially DC home tomorrow afternoon. ? ? ?Melvyn Neth, MD ?[facility]  ?

## 2021-06-30 LAB — BASIC METABOLIC PANEL
Anion gap: 5 (ref 5–15)
BUN: 13 mg/dL (ref 6–20)
CO2: 28 mmol/L (ref 22–32)
Calcium: 8.8 mg/dL — ABNORMAL LOW (ref 8.9–10.3)
Chloride: 105 mmol/L (ref 98–111)
Creatinine, Ser: 0.71 mg/dL (ref 0.44–1.00)
GFR, Estimated: >60 mL/min (ref >60)
Glucose, Bld: 105 mg/dL — ABNORMAL HIGH (ref 70–99)
Potassium: 4 mmol/L (ref 3.5–5.1)
Sodium: 138 mmol/L (ref 135–145)

## 2021-06-30 LAB — MAGNESIUM: Magnesium: 2 mg/dL (ref 1.7–2.4)

## 2021-06-30 MED ORDER — OXYCODONE HCL 5 MG PO TABS: 5.0000 mg | ORAL_TABLET | Freq: Four times a day (QID) | ORAL | 0 refills | Status: DC | PRN

## 2021-06-30 MED ORDER — IBUPROFEN 600 MG PO TABS: 600.0000 mg | ORAL_TABLET | Freq: Three times a day (TID) | ORAL | 1 refills | Status: DC | PRN

## 2021-06-30 MED ORDER — ACETAMINOPHEN 500 MG PO TABS: 1000.0000 mg | ORAL_TABLET | Freq: Four times a day (QID) | ORAL | Status: AC | PRN

## 2021-06-30 NOTE — Discharge Summary (Signed)
Patient ID: ?Floria Raveling Coyt ?MRN: [id number] ?DOB/AGE: [birth year] 56 y.o. ? ?Admit date: 06/26/2021 ?Discharge date: 06/30/2021 ? ? ?Discharge Diagnoses:  ?Principal Problem: ?  SBO (small bowel obstruction) ([place]) ? ? ?Procedures: Exploratory laparotomy and small bowel resection ? ?Hospital Course: Patient was admitted on late night on 06/26/2021 with a small bowel obstruction related to a closed-loop obstruction was taken to the operating room on the same night for exploratory laparotomy and small bowel resection with anastomosis.  Surgery went well and postoperatively the patient has had a good recovery.  Her NG tube was removed on 3/17 and her diet has been slowly advanced over the weekend.  She has been tolerating well, having flatus and denying any nausea or worsening pain.  She has been ambulating, voiding without issues, and her pain is well controlled.  On exam, she no acute distress and with stable vital signs.  Her abdomen is soft, nondistended, nontender to palpation with only some mild discomfort.  The midline incision is clean, dry, intact with staples.  She is ready for discharge to home today.  She will follow-up with Dr. Lysle Pearl in about a week for staple removal. ? ?Consults: None ? ?Disposition: Discharge disposition: 01-Home or Self Care ? ? ? ? ? ? ?Discharge Instructions   ? ? Call MD for:  difficulty breathing, headache or visual disturbances   Complete by: As directed ?  ? Call MD for:  persistant nausea and vomiting   Complete by: As directed ?  ? Call MD for:  redness, tenderness, or signs of infection (pain, swelling, redness, odor or green/yellow discharge around incision site)   Complete by: As directed ?  ? Call MD for:  severe uncontrolled pain   Complete by: As directed ?  ? Call MD for:  temperature >100.4   Complete by: As directed ?  ? Diet general   Complete by: As directed ?  ? Please continue a soft diet for the next 3-5 days to allow your intestines to adjust better.  Then may resume  your normal diet.  ? Discharge instructions   Complete by: As directed ?  ? 1.  Patient may shower, but do not scrub wounds heavily and dab dry only. ?2.  Do not submerge wounds in pool/tub until fully healed. ?3.  Do not remove staples. ?4.  May apply ice packs to the wounds for comfort. ?5.  Please continue a soft diet for the next 3-5 days to allow your intestines to adjust better.  Then may resume your normal diet.  ? Driving Restrictions   Complete by: As directed ?  ? Do not drive while taking narcotics for pain control.  Prior to driving, make sure you are able to rotate right and left to look at blindspots without significant pain or discomfort.  ? Increase activity slowly   Complete by: As directed ?  ? Lifting restrictions   Complete by: As directed ?  ? No heavy lifting or pushing of more than 10-15 lbs for 4 weeks.  ? No dressing needed   Complete by: As directed ?  ? ?  ? ?Allergies as of 06/30/2021   ? ?   Reactions  ? Codeine   ? Other reaction(s): Vomiting  ? Macrobid [nitrofurantoin Monohyd Macro]   ? Sulfa Antibiotics   ? ?  ? ?  ?Medication List  ?  ? ?TAKE these medications   ? ?acetaminophen 500 MG tablet ?Commonly known as: TYLENOL ?Take 2 tablets (1,000  mg total) by mouth every 6 (six) hours as needed for mild pain. ?  ?ibuprofen 600 MG tablet ?Commonly known as: ADVIL ?Take 1 tablet (600 mg total) by mouth every 8 (eight) hours as needed for moderate pain. ?  ?Multi-Vitamins Tabs ?Take 1 tablet by mouth daily. ?  ?NON FORMULARY ?Estriol 1 mg/g Use 1 g per vagina twice weekly Disp 30 g tube with 2 refills ?  ?oxyCODONE 5 MG immediate release tablet ?Commonly known as: Oxy IR/ROXICODONE ?Take 1 tablet (5 mg total) by mouth every 6 (six) hours as needed for severe pain. ?  ? ?  ? ?  ?  ? ? ?  ?Discharge Care Instructions  ?(From admission, onward)  ?  ? ? ?  ? ?  Start     Ordered  ? 06/30/21 0000  No dressing needed       ? 06/30/21 1217  ? ?  ?  ? ?  ? ? Follow-up Information   ? ? Sakai,  Isami, DO Follow up in 1 week(s).   ?Specialty: Surgery ?Why: Please contact Dr. Ines Bloomer office for an appointment in a week for staple removal. ?Contact information: ?[address] ?[place] [address] ?[phone] ? ? ?  ?  ? ?  ?  ? ?  ? ? ? ?

## 2021-06-30 NOTE — Plan of Care (Signed)

## 2022-02-24 ENCOUNTER — Ambulatory Visit: Payer: Managed Care, Other (non HMO) | Admitting: Dermatology

## 2022-02-24 DIAGNOSIS — L821 Other seborrheic keratosis: Secondary | ICD-10-CM

## 2022-02-24 DIAGNOSIS — D229 Melanocytic nevi, unspecified: Secondary | ICD-10-CM

## 2022-02-24 DIAGNOSIS — Z1283 Encounter for screening for malignant neoplasm of skin: Secondary | ICD-10-CM | POA: Diagnosis not present

## 2022-02-24 DIAGNOSIS — L578 Other skin changes due to chronic exposure to nonionizing radiation: Secondary | ICD-10-CM | POA: Diagnosis not present

## 2022-02-24 DIAGNOSIS — L814 Other melanin hyperpigmentation: Secondary | ICD-10-CM

## 2022-02-24 DIAGNOSIS — D1801 Hemangioma of skin and subcutaneous tissue: Secondary | ICD-10-CM

## 2022-02-24 NOTE — Progress Notes (Signed)
   Follow-Up Visit   Subjective  Kari Guerrero is a 56 y.o. female who presents for the following: Annual Exam (Tbse, some spots at left cheek, back, and top of head. ). The patient presents for Total-Body Skin Exam (TBSE) for skin cancer screening and mole check.  The patient has spots, moles and lesions to be evaluated, some may be new or changing and the patient has concerns that these could be cancer. Birth mark on right knee  The following portions of the chart were reviewed this encounter and updated as appropriate:  Tobacco  Allergies  Meds  Problems  Med Hx  Surg Hx  Fam Hx     Review of Systems: No other skin or systemic complaints except as noted in HPI or Assessment and Plan.  Objective  Well appearing patient in no apparent distress; mood and affect are within normal limits.  A full examination was performed including scalp, head, eyes, ears, nose, lips, neck, chest, axillae, abdomen, back, buttocks, bilateral upper extremities, bilateral lower extremities, hands, feet, fingers, toes, fingernails, and toenails. All findings within normal limits unless otherwise noted below.   Assessment & Plan   Lentigines At back - Scattered tan macules - Due to sun exposure - Benign-appearing, observe - Recommend daily broad spectrum sunscreen SPF 30+ to sun-exposed areas, reapply every 2 hours as needed. - Call for any changes  Seborrheic Keratoses At left cheek , back , scalp - Stuck-on, waxy, tan-brown papules and/or plaques  - Benign-appearing - Discussed benign etiology and prognosis. - Observe - Call for any changes  Melanocytic Nevi - Tan-brown and/or pink-flesh-colored symmetric macules and papules - Benign appearing on exam today - Observation - Call clinic for new or changing moles - Recommend daily use of broad spectrum spf 30+ sunscreen to sun-exposed areas.   Hemangiomas - Red papules - Discussed benign nature - Observe - Call for any  changes  Actinic Damage - Chronic condition, secondary to cumulative UV/sun exposure - diffuse scaly erythematous macules with underlying dyspigmentation - Recommend daily broad spectrum sunscreen SPF 30+ to sun-exposed areas, reapply every 2 hours as needed.  - Staying in the shade or wearing long sleeves, sun glasses (UVA+UVB protection) and wide brim hats (4-inch brim around the entire circumference of the hat) are also recommended for sun protection.  - Call for new or changing lesions.  Skin cancer screening performed today. Return if symptoms worsen or fail to improve, for or yearly tbse patient will decide. IRuthell Rummage, CMA, am acting as scribe for Sarina Ser, MD. Documentation: I have reviewed the above documentation for accuracy and completeness, and I agree with the above.  Sarina Ser, MD

## 2022-02-24 NOTE — Patient Instructions (Addendum)
Seborrheic Keratosis  What causes seborrheic keratoses? Seborrheic keratoses are harmless, common skin growths that first appear during adult life.  As time goes by, more growths appear.  Some people may develop a large number of them.  Seborrheic keratoses appear on both covered and uncovered body parts.  They are not caused by sunlight.  The tendency to develop seborrheic keratoses can be inherited.  They vary in color from skin-colored to gray, brown, or even black.  They can be either smooth or have a rough, warty surface.   Seborrheic keratoses are superficial and look as if they were stuck on the skin.  Under the microscope this type of keratosis looks like layers upon layers of skin.  That is why at times the top layer may seem to fall off, but the rest of the growth remains and re-grows.    Treatment Seborrheic keratoses do not need to be treated, but can easily be removed in the office.  Seborrheic keratoses often cause symptoms when they rub on clothing or jewelry.  Lesions can be in the way of shaving.  If they become inflamed, they can cause itching, soreness, or burning.  Removal of a seborrheic keratosis can be accomplished by freezing, burning, or surgery. If any spot bleeds, scabs, or grows rapidly, please return to have it checked, as these can be an indication of a skin cancer.  Cryotherapy Aftercare  Wash gently with soap and water everyday.   Apply Vaseline and Band-Aid daily until healed.       Melanoma ABCDEs  Melanoma is the most dangerous type of skin cancer, and is the leading cause of death from skin disease.  You are more likely to develop melanoma if you: Have light-colored skin, light-colored eyes, or red or blond hair Spend a lot of time in the sun Tan regularly, either outdoors or in a tanning bed Have had blistering sunburns, especially during childhood Have a close family member who has had a melanoma Have atypical moles or large birthmarks  Early  detection of melanoma is key since treatment is typically straightforward and cure rates are extremely high if we catch it early.   The first sign of melanoma is often a change in a mole or a new dark spot.  The ABCDE system is a way of remembering the signs of melanoma.  A for asymmetry:  The two halves do not match. B for border:  The edges of the growth are irregular. C for color:  A mixture of colors are present instead of an even brown color. D for diameter:  Melanomas are usually (but not always) greater than 6mm - the size of a pencil eraser. E for evolution:  The spot keeps changing in size, shape, and color.  Please check your skin once per month between visits. You can use a small mirror in front and a large mirror behind you to keep an eye on the back side or your body.   If you see any new or changing lesions before your next follow-up, please call to schedule a visit.  Please continue daily skin protection including broad spectrum sunscreen SPF 30+ to sun-exposed areas, reapplying every 2 hours as needed when you're outdoors.   Staying in the shade or wearing long sleeves, sun glasses (UVA+UVB protection) and wide brim hats (4-inch brim around the entire circumference of the hat) are also recommended for sun protection.     Due to recent changes in healthcare laws, you may see results of   your pathology and/or laboratory studies on MyChart before the doctors have had a chance to review them. We understand that in some cases there may be results that are confusing or concerning to you. Please understand that not all results are received at the same time and often the doctors may need to interpret multiple results in order to provide you with the best plan of care or course of treatment. Therefore, we ask that you please give us 2 business days to thoroughly review all your results before contacting the office for clarification. Should we see a critical lab result, you will be contacted  sooner.   If You Need Anything After Your Visit  If you have any questions or concerns for your doctor, please call our main line at [phone] and press option 4 to reach your doctor's medical assistant. If no one answers, please leave a voicemail as directed and we will return your call as soon as possible. Messages left after 4 pm will be answered the following business day.   You may also send us a message via MyChart. We typically respond to MyChart messages within 1-2 business days.  For prescription refills, please ask your pharmacy to contact our office. Our fax number is [phone].  If you have an urgent issue when the clinic is closed that cannot wait until the next business day, you can page your doctor at the number below.    Please note that while we do our best to be available for urgent issues outside of office hours, we are not available 24/7.   If you have an urgent issue and are unable to reach us, you may choose to seek medical care at your doctor's office, retail clinic, urgent care center, or emergency room.  If you have a medical emergency, please immediately call 911 or go to the emergency department.  Pager Numbers  - Dr. Kowalski: [phone]  - Dr. Moye: [phone]  - Dr. Stewart: [phone]  In the event of inclement weather, please call our main line at [phone] for an update on the status of any delays or closures.  Dermatology Medication Tips: Please keep the boxes that topical medications come in in order to help keep track of the instructions about where and how to use these. Pharmacies typically print the medication instructions only on the boxes and not directly on the medication tubes.   If your medication is too expensive, please contact our office at [phone] option 4 or send us a message through MyChart.   We are unable to tell what your co-pay for medications will be in advance as this is different depending on your insurance  coverage. However, we may be able to find a substitute medication at lower cost or fill out paperwork to get insurance to cover a needed medication.   If a prior authorization is required to get your medication covered by your insurance company, please allow us 1-2 business days to complete this process.  Drug prices often vary depending on where the prescription is filled and some pharmacies may offer cheaper prices.  The website www.goodrx.com contains coupons for medications through different pharmacies. The prices here do not account for what the cost may be with help from insurance (it may be cheaper with your insurance), but the website can give you the price if you did not use any insurance.  - You can print the associated coupon and take it with your prescription to the pharmacy.  - You may also stop   by our office during regular business hours and pick up a GoodRx coupon card.  - If you need your prescription sent electronically to a different pharmacy, notify our office through [facility] MyChart or by phone at [phone] option 4.     Si Usted Necesita Algo Despus de Su Visita  Tambin puede enviarnos un mensaje a travs de MyChart. Por lo general respondemos a los mensajes de MyChart en el transcurso de 1 a 2 das hbiles.  Para renovar recetas, por favor pida a su farmacia que se ponga en contacto con nuestra oficina. Nuestro nmero de fax es el [phone].  Si tiene un asunto urgente cuando la clnica est cerrada y que no puede esperar hasta el siguiente da hbil, puede llamar/localizar a su doctor(a) al nmero que aparece a continuacin.   Por favor, tenga en cuenta que aunque hacemos todo lo posible para estar disponibles para asuntos urgentes fuera del horario de oficina, no estamos disponibles las 24 horas del da, los 7 das de la semana.   Si tiene un problema urgente y no puede comunicarse con nosotros, puede optar por buscar atencin mdica  en el consultorio de  su doctor(a), en una clnica privada, en un centro de atencin urgente o en una sala de emergencias.  Si tiene una emergencia mdica, por favor llame inmediatamente al 911 o vaya a la sala de emergencias.  Nmeros de bper  - Dr. Kowalski: [phone]  - Dra. Moye: [phone]  - Dra. Stewart: [phone]  En caso de inclemencias del tiempo, por favor llame a nuestra lnea principal al [phone] para una actualizacin sobre el estado de cualquier retraso o cierre.  Consejos para la medicacin en dermatologa: Por favor, guarde las cajas en las que vienen los medicamentos de uso tpico para ayudarle a seguir las instrucciones sobre dnde y cmo usarlos. Las farmacias generalmente imprimen las instrucciones del medicamento slo en las cajas y no directamente en los tubos del medicamento.   Si su medicamento es muy caro, por favor, pngase en contacto con nuestra oficina llamando al [phone] y presione la opcin 4 o envenos un mensaje a travs de MyChart.   No podemos decirle cul ser su copago por los medicamentos por adelantado ya que esto es diferente dependiendo de la cobertura de su seguro. Sin embargo, es posible que podamos encontrar un medicamento sustituto a menor costo o llenar un formulario para que el seguro cubra el medicamento que se considera necesario.   Si se requiere una autorizacin previa para que su compaa de seguros cubra su medicamento, por favor permtanos de 1 a 2 das hbiles para completar este proceso.  Los precios de los medicamentos varan con frecuencia dependiendo del lugar de dnde se surte la receta y alguna farmacias pueden ofrecer precios ms baratos.  El sitio web www.goodrx.com tiene cupones para medicamentos de diferentes farmacias. Los precios aqu no tienen en cuenta lo que podra costar con la ayuda del seguro (puede ser ms barato con su seguro), pero el sitio web puede darle el precio si no utiliz ningn seguro.  - Puede imprimir el  cupn correspondiente y llevarlo con su receta a la farmacia.  - Tambin puede pasar por nuestra oficina durante el horario de atencin regular y recoger una tarjeta de cupones de GoodRx.  - Si necesita que su receta se enve electrnicamente a una farmacia diferente, informe a nuestra oficina a travs de MyChart de [facility] o por telfono llamando al [phone] y presione la opcin 4.  

## 2022-03-01 ENCOUNTER — Encounter: Payer: Self-pay | Admitting: Dermatology

## 2022-03-21 IMAGING — DX DG CHEST 1V PORT
2 series · 2 of 2 positions shown · non-contrast
Comparison: CT abdomen pelvis dated 06/27/2021.

CLINICAL DATA: NG placement.

EXAM:
PORTABLE CHEST 1 VIEW

[abdomen supine (1 of 2)]
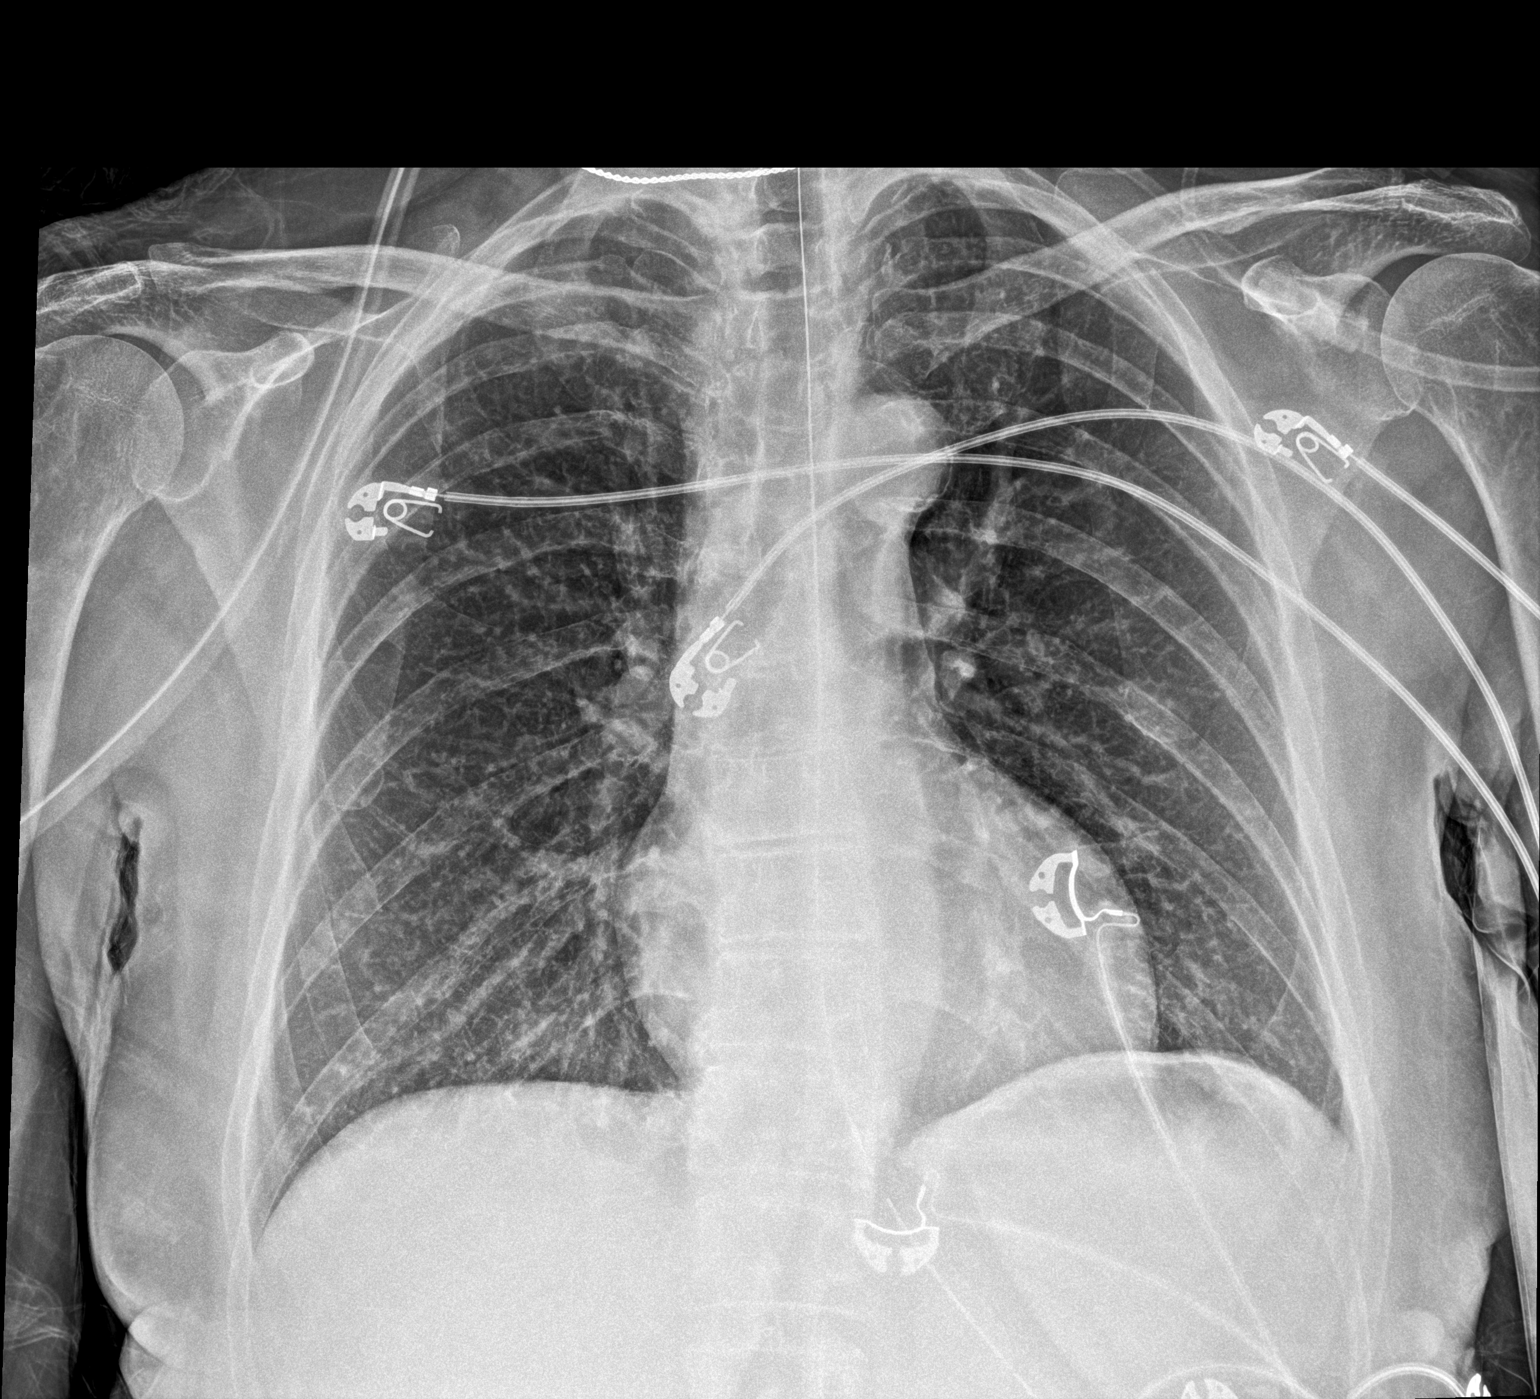

[abdomen supine (2 of 2)]
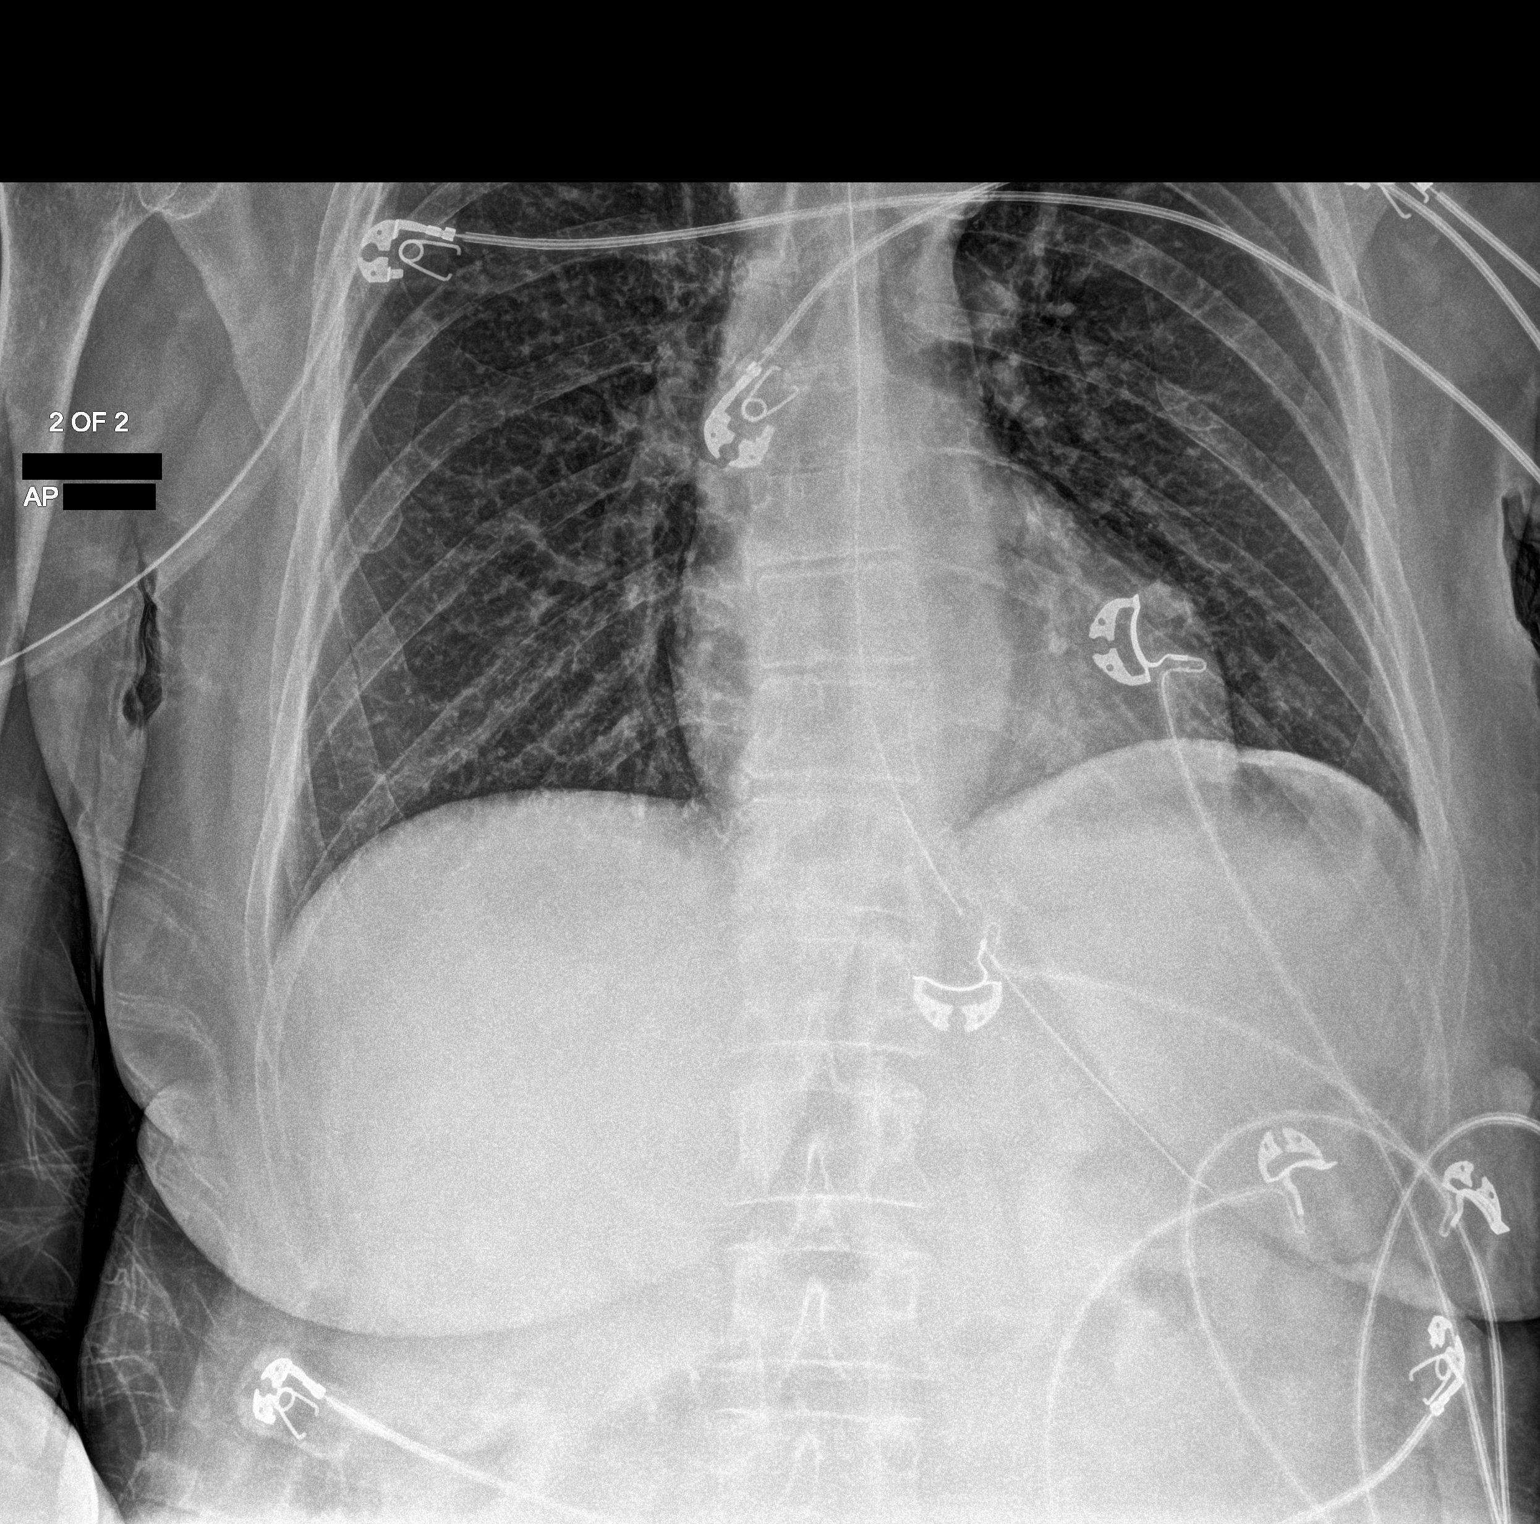

[2 of 2 positions shown; findings below may reference images not displayed]

FINDINGS: Enteric tube with side-port just distal to the GE junction and tip
in the proximal stomach. The lungs are clear. There is no pleural
effusion pneumothorax. The cardiac silhouette is within limits.
Atherosclerotic calcification of the aorta. No acute osseous
pathology.
IMPRESSION: 1. No acute cardiopulmonary process.
2. Enteric tube with tip in the proximal stomach.

## 2022-04-29 ENCOUNTER — Other Ambulatory Visit: Payer: Self-pay | Admitting: Obstetrics and Gynecology

## 2022-04-29 DIAGNOSIS — Z1231 Encounter for screening mammogram for malignant neoplasm of breast: Secondary | ICD-10-CM

## 2022-05-22 ENCOUNTER — Ambulatory Visit
Admission: RE | Admit: 2022-05-22 | Discharge: 2022-05-22 | Disposition: A | Discharge status: Home / Self Care | Payer: Managed Care, Other (non HMO) | Source: Ambulatory Visit | Attending: Obstetrics and Gynecology | Admitting: Obstetrics and Gynecology

## 2022-05-22 DIAGNOSIS — Z1231 Encounter for screening mammogram for malignant neoplasm of breast: Secondary | ICD-10-CM

## 2022-05-28 ENCOUNTER — Encounter: Payer: Self-pay | Admitting: Surgery

## 2022-05-28 ENCOUNTER — Ambulatory Visit: Payer: Managed Care, Other (non HMO) | Admitting: Certified Registered"

## 2022-05-28 ENCOUNTER — Ambulatory Visit
Admission: RE | Admit: 2022-05-28 | Discharge: 2022-05-28 | Disposition: A | Discharge status: Home / Self Care | Payer: Managed Care, Other (non HMO) | Attending: Surgery | Admitting: Surgery

## 2022-05-28 ENCOUNTER — Encounter
Admission: RE | Disposition: A | Discharge status: Home / Self Care | Payer: Self-pay | Source: Home / Self Care | Attending: Surgery

## 2022-05-28 DIAGNOSIS — Z86010 Personal history of colonic polyps: Secondary | ICD-10-CM | POA: Diagnosis not present

## 2022-05-28 DIAGNOSIS — Z803 Family history of malignant neoplasm of breast: Secondary | ICD-10-CM | POA: Diagnosis not present

## 2022-05-28 DIAGNOSIS — Z800 Family history of malignant neoplasm of digestive organs: Secondary | ICD-10-CM | POA: Insufficient documentation

## 2022-05-28 DIAGNOSIS — Z1211 Encounter for screening for malignant neoplasm of colon: Secondary | ICD-10-CM | POA: Insufficient documentation

## 2022-05-28 DIAGNOSIS — K640 First degree hemorrhoids: Secondary | ICD-10-CM | POA: Insufficient documentation

## 2022-05-28 DIAGNOSIS — K5730 Diverticulosis of large intestine without perforation or abscess without bleeding: Secondary | ICD-10-CM | POA: Insufficient documentation

## 2022-05-28 HISTORY — PX: COLONOSCOPY WITH PROPOFOL: SHX5780

## 2022-05-28 SURGERY — COLONOSCOPY WITH PROPOFOL
Anesthesia: General

## 2022-05-28 MED ORDER — PROPOFOL 500 MG/50ML IV EMUL
INTRAVENOUS | Status: DC | PRN
  Administered 2022-05-28: 70 mg via INTRAVENOUS
  Administered 2022-05-28: 150 ug/kg/min via INTRAVENOUS

## 2022-05-28 MED ORDER — SODIUM CHLORIDE 0.9 % IV SOLN: INTRAVENOUS | Status: DC

## 2022-05-28 MED ORDER — LIDOCAINE HCL (CARDIAC) PF 100 MG/5ML IV SOSY
PREFILLED_SYRINGE | INTRAVENOUS | Status: DC | PRN
  Administered 2022-05-28: 100 mg via INTRAVENOUS

## 2022-05-28 NOTE — H&P (Signed)
Subjective:   CC: Family history of colon cancer [Z80.0]  HPI: Kari Guerrero is a 57 y.o. female who returns for evaluation of above. Previous cscope in 2018, negative for polyps, recommended 26yrf/u due to family history.  No specific complaints today.  Past Medical History: has a past medical history of Abnormal uterine bleeding, Amenorrhea, Chickenpox, Gilbert's disease, History of adenomatous polyp of colon, and History of hemorrhoids.  Past Surgical History: has a past surgical history that includes Tubal ligation; Cesarean section; Wisdom tooth removal; Hemorrhoid surgery (03/15/2007); Colonoscopy (05/01/2011); Colonoscopy (02/09/2017); and Resection Small Bowel (06/2021).  Family History: family history includes Breast cancer in her maternal grandmother and paternal aunt; Cancer in her maternal grandfather; Colon cancer (age of onset: 556 in her father.  Social History: reports that she has never smoked. She has never used smokeless tobacco. She reports that she does not drink alcohol and does not use drugs.  Current Medications: has a current medication list which includes the following prescription(s): multivitamin, acetaminophen, and compound medication.  Allergies:  Allergies  Allergen Reactions  Codeine Vomiting  Macrobid [Nitrofurantoin Monohyd/M-Cryst] Other (See Comments)  High fever  Sulfa (Sulfonamide Antibiotics) Rash   ROS:  A 15 point review of systems was performed and pertinent positives and negatives noted in HPI  Objective:    BP (!) 127/100  Pulse 70  Ht 162.6 cm (5' 4"$ )  Wt 67.1 kg (147 lb 14.9 oz)  LMP 12/02/2013  BMI 25.39 kg/m   Constitutional : No distress, cooperative, alert  Lymphatics/Throat: Supple with no lymphadenopathy  Respiratory: Clear to auscultation bilaterally  Cardiovascular: Regular rate and rhythm  Gastrointestinal: Soft, non-tender, non-distended, no organomegaly.  Musculoskeletal: Steady gait and movement  Skin: Cool and  moist  Psychiatric: Normal affect, non-agitated, not confused     LABS:  N/a   RADS: N/a  Assessment:    Family history of colon cancer [Z80.0]- will proceed with colonoscopy. Pt specifically requested I provide colonoscopy care since I have operated on her in the past.  Plan:    1. Family history of colon cancer [Z80.0] R/b/a discussed. Risks include bleeding, perforation. Benefits include diagnostic, curative procedure if needed. Alternatives include continued observation. Pt verbalized understanding.  The patient verbalized understanding and all questions were answered to the patient's satisfaction.  No need to proceed with fecal occult testing. Also notify anesthesia for concerns of post op amnesia in the past  labs/images/medications/previous chart entries reviewed personally and relevant changes/updates noted above.

## 2022-05-28 NOTE — Interval H&P Note (Signed)
History and Physical Interval Note:  05/28/2022 8:11 AM  Kari Guerrero  has presented today for surgery, with the diagnosis of Family history of colon cancer Z80.0.  The various methods of treatment have been discussed with the patient and family. After consideration of risks, benefits and other options for treatment, the patient has consented to  Procedure(s): COLONOSCOPY WITH PROPOFOL (N/A) as a surgical intervention.  The patient's history has been reviewed, patient examined, no change in status, stable for surgery.  I have reviewed the patient's chart and labs.  Questions were answered to the patient's satisfaction.     [place] Frieden Lysle Pearl

## 2022-05-28 NOTE — Transfer of Care (Signed)
Immediate Anesthesia Transfer of Care Note  Patient: Kari Guerrero  Procedure(s) Performed: COLONOSCOPY WITH PROPOFOL  Patient Location: PACU  Anesthesia Type:General  Level of Consciousness: awake, alert , and patient cooperative  Airway & Oxygen Therapy: Patient Spontanous Breathing  Post-op Assessment: Report given to RN and Post -op Vital signs reviewed and stable  Post vital signs: Reviewed and stable  Last Vitals:  Vitals Value Taken Time  BP    Temp    Pulse    Resp    SpO2      Last Pain:  Vitals:   05/28/22 0756  TempSrc: Temporal         Complications: No notable events documented.

## 2022-05-28 NOTE — Op Note (Addendum)
[facility] Patient Name: Kari Guerrero Procedure Date: 05/28/2022 8:12 AM MRN: [id number] Account #: [id number] Date of Birth: [birth year] Admit Type: Outpatient Age: 57 Room: [facility] ENDO ROOM 3 Gender: Female Note Status: Addendum Instrument Name: Park Meo [id number] Procedure:             Colonoscopy Providers:             Benjamine Sprague MD, MD Referring MD:          Boykin Nearing, MD (Referring MD) Medicines:             Propofol per Anesthesia Complications:         No immediate complications. Procedure:             Pre-Anesthesia Assessment:                        - After reviewing the risks and benefits, the patient                         was deemed in satisfactory condition to undergo the                         procedure in an ambulatory setting.                        After obtaining informed consent, the colonoscope was                         passed under direct vision. Throughout the procedure,                         the patient's blood pressure, pulse, and oxygen                         saturations were monitored continuously. The                         Colonoscope was introduced through the anus and                         advanced to the the cecum, identified by the ileocecal                         valve. The colonoscopy was performed with ease. The                         patient tolerated the procedure well. The quality of                         the bowel preparation was good. Findings:      The perianal and digital rectal examinations were normal.      Multiple large-mouthed and medium-mouthed diverticula were found in the       entire colon. Estimated blood loss: none.      Non-bleeding internal hemorrhoids were found during retroflexion. The       hemorrhoids were Grade I (internal hemorrhoids that do not prolapse).      The exam was otherwise without abnormality. Impression:            - Diverticulosis in the  entire  examined colon.                        - Non-bleeding internal hemorrhoids.                        - The examination was otherwise normal.                        - No specimens collected. Recommendation:        - Discharge patient to home.                        - Resume previous diet.                        - Repeat colonoscopy in 5 years for screening purposes. Procedure Code(s):     --- Professional ---                        [id number], Colonoscopy, flexible; diagnostic, including                         collection of specimen(s) by brushing or washing, when                         performed (separate procedure) Diagnosis Code(s):     --- Professional ---                        K64.0, First degree hemorrhoids                        K57.30, Diverticulosis of large intestine without                         perforation or abscess without bleeding CPT copyright 2022 American Medical Association. All rights reserved. The codes documented in this report are preliminary and upon coder review may  be revised to meet current compliance requirements. Dr. Sheppard Penton, MD Benjamine Sprague MD, MD 05/28/2022 8:34:42 AM This report has been signed electronically. Number of Addenda: 1 Note Initiated On: 05/28/2022 8:12 AM Scope Withdrawal Time: 0 hours 8 minutes 26 seconds  Total Procedure Duration: 0 hours 14 minutes 34 seconds  Estimated Blood Loss:  Estimated blood loss: none.      [facility] Addendum Number: 1   Addendum Date: 06/06/2022 1:49:15 PM      INDICATION FOR PROCEDURE: Family history of colon cancer [Z80.0] Dr. Sheppard Penton, MD Benjamine Sprague MD, MD 06/06/2022 1:50:18 PM This report has been signed electronically.

## 2022-05-28 NOTE — Anesthesia Postprocedure Evaluation (Signed)
Anesthesia Post Note  Patient: Kari Guerrero  Procedure(s) Performed: COLONOSCOPY WITH PROPOFOL  Patient location during evaluation: PACU Anesthesia Type: General Level of consciousness: awake and awake and alert Pain management: satisfactory to patient Vital Signs Assessment: post-procedure vital signs reviewed and stable Respiratory status: nonlabored ventilation and respiratory function stable Cardiovascular status: stable Anesthetic complications: no   There were no known notable events for this encounter.   Last Vitals:  Vitals:   05/28/22 0837 05/28/22 0847  BP: 112/74 115/72  Pulse: 85 80  Resp: 20 20  Temp: (!) 35.5 C   SpO2: 99% 100%    Last Pain:  Vitals:   05/28/22 0847  TempSrc:   PainSc: 0-No pain                 VAN STAVEREN,Hidaya Daniel

## 2022-05-28 NOTE — Anesthesia Preprocedure Evaluation (Signed)
Anesthesia Evaluation  Patient identified by MRN, date of birth, ID band Patient awake    Reviewed: Allergy & Precautions, H&P , NPO status , Patient's Chart, lab work & pertinent test results  Airway Mallampati: III  TM Distance: >3 FB Neck ROM: full    Dental  (+) Teeth Intact   Pulmonary neg pulmonary ROS   Pulmonary exam normal        Cardiovascular negative cardio ROS Normal cardiovascular exam Rhythm:Regular Rate:Normal     Neuro/Psych negative neurological ROS  negative psych ROS   GI/Hepatic negative GI ROS, Neg liver ROS,,,  Endo/Other  negative endocrine ROS    Renal/GU negative Renal ROS  negative genitourinary   Musculoskeletal negative musculoskeletal ROS (+)    Abdominal Normal abdominal exam  (+)   Peds negative pediatric ROS (+)  Hematology negative hematology ROS (+)   Anesthesia Other Findings Past Medical History: No date: Dysuria No date: UTI (lower urinary tract infection)  Past Surgical History: 06/27/2021: BOWEL RESECTION; N/A     Comment:  Procedure: SMALL BOWEL RESECTION;  Surgeon: Benjamine Sprague, DO;  Location: [facility];  Service: General;                Laterality: N/A; No date: CESAREAN SECTION No date: COLONOSCOPY WITH PROPOFOL 06/27/2021: LAPAROTOMY; N/A     Comment:  Procedure: EXPLORATORY LAPAROTOMY;  Surgeon: Benjamine Sprague, DO;  Location: [facility];  Service: General;                Laterality: N/A;  BMI    Body Mass Index: 25.40 kg/m      Reproductive/Obstetrics negative OB ROS                             Anesthesia Physical Anesthesia Plan  ASA: 2  Anesthesia Plan: General   Post-op Pain Management:    Induction: Intravenous  PONV Risk Score and Plan: Propofol infusion and TIVA  Airway Management Planned: Natural Airway and Nasal Cannula  Additional Equipment:   Intra-op Plan:   Post-operative Plan:    Informed Consent: I have reviewed the patients History and Physical, chart, labs and discussed the procedure including the risks, benefits and alternatives for the proposed anesthesia with the patient or authorized representative who has indicated his/her understanding and acceptance.     Dental Advisory Given  Plan Discussed with: CRNA and Surgeon  Anesthesia Plan Comments:        Anesthesia Quick Evaluation

## 2022-05-29 ENCOUNTER — Encounter: Payer: Self-pay | Admitting: Surgery

## 2023-02-26 ENCOUNTER — Ambulatory Visit: Payer: Managed Care, Other (non HMO) | Admitting: Dermatology

## 2023-02-26 DIAGNOSIS — L814 Other melanin hyperpigmentation: Secondary | ICD-10-CM

## 2023-02-26 DIAGNOSIS — D229 Melanocytic nevi, unspecified: Secondary | ICD-10-CM | POA: Diagnosis not present

## 2023-02-26 DIAGNOSIS — Z1283 Encounter for screening for malignant neoplasm of skin: Secondary | ICD-10-CM | POA: Diagnosis not present

## 2023-02-26 DIAGNOSIS — L820 Inflamed seborrheic keratosis: Secondary | ICD-10-CM

## 2023-02-26 DIAGNOSIS — L578 Other skin changes due to chronic exposure to nonionizing radiation: Secondary | ICD-10-CM

## 2023-02-26 NOTE — Patient Instructions (Addendum)

## 2023-02-26 NOTE — Progress Notes (Signed)
   Follow-Up Visit   Subjective  Kari Guerrero is a 57 y.o. female who presents for the following: Skin Cancer Screening and Full Body Skin Exam  The patient presents for Total-Body Skin Exam (TBSE) for skin cancer screening and mole check. The patient has spots, moles and lesions to be evaluated, some may be new or changing. No history of skin cancer. She has a few spots to check on the scalp, left breast, and back. She picks at these areas.    Patient accompanied by husband.   The following portions of the chart were reviewed this encounter and updated as appropriate: medications, allergies, medical history  Review of Systems:  No other skin or systemic complaints except as noted in HPI or Assessment and Plan.  Objective  Well appearing patient in no apparent distress; mood and affect are within normal limits.  A full examination was performed including scalp, head, eyes, ears, nose, lips, neck, chest, axillae, abdomen, back, buttocks, bilateral upper extremities, bilateral lower extremities, hands, feet, fingers, toes, fingernails, and toenails. All findings within normal limits unless otherwise noted below.   Relevant physical exam findings are noted in the Assessment and Plan.  Scalp x 1, R upper back x 1 (2) Erythematous stuck-on, waxy papule or plaque    Assessment & Plan   SKIN CANCER SCREENING PERFORMED TODAY.  ACTINIC DAMAGE - Chronic condition, secondary to cumulative UV/sun exposure - diffuse scaly erythematous macules with underlying dyspigmentation - Recommend daily broad spectrum sunscreen SPF 30+ to sun-exposed areas, reapply every 2 hours as needed.  - Staying in the shade or wearing long sleeves, sun glasses (UVA+UVB protection) and wide brim hats (4-inch brim around the entire circumference of the hat) are also recommended for sun protection.  - Call for new or changing lesions.  LENTIGINES, HEMANGIOMAS - Benign normal skin lesions - Benign-appearing - Call  for any changes  SEBORRHEIC KERATOSIS - Stuck-on, waxy, tan-brown papules and/or plaques, including left breast  - Benign-appearing - Discussed benign etiology and prognosis. - Observe - Call for any changes  MELANOCYTIC NEVI - Tan-brown and/or pink-flesh-colored symmetric macules and papules - Benign appearing on exam today - Observation - Call clinic for new or changing moles - Recommend daily use of broad spectrum spf 30+ sunscreen to sun-exposed areas.   CONGENITAL NEVUS  Exam: Brown flat papule of the right medial knee   Treatment: Benign, observe.    Inflamed seborrheic keratosis (2) Scalp x 1, R upper back x 1  Symptomatic, irritating, patient would like treated.  Destruction of lesion - Scalp x 1, R upper back x 1 (2) Complexity: simple   Destruction method: cryotherapy   Informed consent: discussed and consent obtained   Timeout:  patient name, date of birth, surgical site, and procedure verified Lesion destroyed using liquid nitrogen: Yes   Region frozen until ice ball extended beyond lesion: Yes   Outcome: patient tolerated procedure well with no complications   Post-procedure details: wound care instructions given     Return in about 2 years (around 02/25/2025), or 1-2 years, for TBSE.  Wendee Beavers, CMA, am acting as scribe for Armida Sans, MD .   Documentation: I have reviewed the above documentation for accuracy and completeness, and I agree with the above.  Armida Sans, MD

## 2023-03-07 ENCOUNTER — Encounter: Payer: Self-pay | Admitting: Dermatology

## 2023-03-09 ENCOUNTER — Other Ambulatory Visit: Payer: Self-pay | Admitting: Obstetrics and Gynecology

## 2023-03-09 DIAGNOSIS — Z1231 Encounter for screening mammogram for malignant neoplasm of breast: Secondary | ICD-10-CM

## 2023-05-25 ENCOUNTER — Ambulatory Visit
Admission: RE | Admit: 2023-05-25 | Discharge: 2023-05-25 | Disposition: A | Discharge status: Home / Self Care | Payer: Managed Care, Other (non HMO) | Source: Ambulatory Visit | Attending: Obstetrics and Gynecology | Admitting: Obstetrics and Gynecology

## 2023-05-25 DIAGNOSIS — Z1231 Encounter for screening mammogram for malignant neoplasm of breast: Secondary | ICD-10-CM | POA: Diagnosis present

## 2024-03-09 ENCOUNTER — Other Ambulatory Visit: Payer: Self-pay | Admitting: Obstetrics and Gynecology

## 2024-03-09 DIAGNOSIS — Z1231 Encounter for screening mammogram for malignant neoplasm of breast: Secondary | ICD-10-CM

## 2024-03-24 ENCOUNTER — Other Ambulatory Visit: Payer: Self-pay | Admitting: Neurology

## 2024-03-24 DIAGNOSIS — R4189 Other symptoms and signs involving cognitive functions and awareness: Secondary | ICD-10-CM

## 2024-03-29 ENCOUNTER — Ambulatory Visit: Admission: RE | Admit: 2024-03-29

## 2024-04-02 ENCOUNTER — Ambulatory Visit
Admission: RE | Admit: 2024-04-02 | Discharge: 2024-04-02 | Disposition: A | Discharge status: Home / Self Care | Source: Ambulatory Visit | Attending: Neurology | Admitting: Neurology

## 2024-04-02 DIAGNOSIS — R4189 Other symptoms and signs involving cognitive functions and awareness: Secondary | ICD-10-CM | POA: Insufficient documentation

## 2025-03-02 ENCOUNTER — Ambulatory Visit: Payer: Managed Care, Other (non HMO) | Admitting: Dermatology
